# Patient Record
Sex: Male | Born: 1990 | Race: White | Hispanic: No | Marital: Single | Smoking: Current every day smoker
Health system: Southern US, Community
[De-identification: ages and names within clinical notes are randomized; demographics above are authoritative.]

## PROBLEM LIST (undated history)

## (undated) DIAGNOSIS — F3181 Bipolar II disorder: Secondary | ICD-10-CM

## (undated) DIAGNOSIS — F32A Depression, unspecified: Secondary | ICD-10-CM

## (undated) DIAGNOSIS — F329 Major depressive disorder, single episode, unspecified: Secondary | ICD-10-CM

## (undated) DIAGNOSIS — F909 Attention-deficit hyperactivity disorder, unspecified type: Secondary | ICD-10-CM

## (undated) DIAGNOSIS — F419 Anxiety disorder, unspecified: Secondary | ICD-10-CM

## (undated) HISTORY — DX: Major depressive disorder, single episode, unspecified: F32.9

## (undated) HISTORY — DX: Depression, unspecified: F32.A

## (undated) HISTORY — DX: Attention-deficit hyperactivity disorder, unspecified type: F90.9

## (undated) HISTORY — DX: Anxiety disorder, unspecified: F41.9

---

## 2011-07-26 ENCOUNTER — Ambulatory Visit: Payer: Self-pay | Admitting: Family Medicine

## 2011-07-26 LAB — HEPATIC FUNCTION PANEL
ALK PHOS: 79 U/L (ref 25–125)
ALT: 12 U/L (ref 3–30)
AST: 13 U/L (ref 2–40)
Bilirubin, Total: 0.2 mg/dL

## 2011-07-26 LAB — BASIC METABOLIC PANEL
BUN: 14 mg/dL (ref 4–21)
Creatinine: 1 mg/dL (ref <=1.3)
Glucose: 85 mg/dL
POTASSIUM: 4.4 mmol/L (ref 3.4–5.3)
SODIUM: 140 mmol/L (ref 137–147)

## 2011-07-26 LAB — CBC AND DIFFERENTIAL
HCT: 43 % (ref 41–53)
Hemoglobin: 14.9 g/dL (ref 13.5–17.5)
NEUTROS ABS: 3 /uL
Platelets: 371 10*3/uL (ref 150–399)
WBC: 6.3 10^3/mL

## 2014-03-25 ENCOUNTER — Emergency Department (HOSPITAL_COMMUNITY): Payer: Self-pay

## 2014-03-25 ENCOUNTER — Encounter (HOSPITAL_COMMUNITY): Payer: Self-pay | Admitting: Emergency Medicine

## 2014-03-25 ENCOUNTER — Emergency Department (HOSPITAL_COMMUNITY)
Admission: EM | Admit: 2014-03-25 | Discharge: 2014-03-25 | Disposition: A | Discharge status: Home / Self Care | Payer: Self-pay | Attending: Emergency Medicine | Admitting: Emergency Medicine

## 2014-03-25 DIAGNOSIS — S0990XA Unspecified injury of head, initial encounter: Secondary | ICD-10-CM | POA: Insufficient documentation

## 2014-03-25 DIAGNOSIS — F3189 Other bipolar disorder: Secondary | ICD-10-CM | POA: Insufficient documentation

## 2014-03-25 DIAGNOSIS — R569 Unspecified convulsions: Secondary | ICD-10-CM | POA: Insufficient documentation

## 2014-03-25 DIAGNOSIS — Y92410 Unspecified street and highway as the place of occurrence of the external cause: Secondary | ICD-10-CM | POA: Insufficient documentation

## 2014-03-25 DIAGNOSIS — R000 Tachycardia, unspecified: Secondary | ICD-10-CM | POA: Insufficient documentation

## 2014-03-25 DIAGNOSIS — Z79899 Other long term (current) drug therapy: Secondary | ICD-10-CM | POA: Insufficient documentation

## 2014-03-25 DIAGNOSIS — Y9389 Activity, other specified: Secondary | ICD-10-CM | POA: Insufficient documentation

## 2014-03-25 HISTORY — DX: Bipolar II disorder: F31.81

## 2014-03-25 LAB — CBC
HEMATOCRIT: 39.7 % (ref 39.0–52.0)
HEMOGLOBIN: 14.1 g/dL (ref 13.0–17.0)
MCH: 31.1 pg (ref 26.0–34.0)
MCHC: 35.5 g/dL (ref 30.0–36.0)
MCV: 87.6 fL (ref 78.0–100.0)
Platelets: 242 10*3/uL (ref 150–400)
RBC: 4.53 MIL/uL (ref 4.22–5.81)
RDW: 12.2 % (ref 11.5–15.5)
WBC: 11.3 10*3/uL — ABNORMAL HIGH (ref 4.0–10.5)

## 2014-03-25 LAB — HEPATIC FUNCTION PANEL
ALK PHOS: 55 U/L (ref 39–117)
ALT: 23 U/L (ref 0–53)
AST: 28 U/L (ref 0–37)
Albumin: 4 g/dL (ref 3.5–5.2)
TOTAL PROTEIN: 7 g/dL (ref 6.0–8.3)

## 2014-03-25 LAB — BASIC METABOLIC PANEL
BUN: 15 mg/dL (ref 6–23)
CHLORIDE: 104 meq/L (ref 96–112)
CO2: 16 meq/L — AB (ref 19–32)
Calcium: 9.1 mg/dL (ref 8.4–10.5)
Creatinine, Ser: 1.16 mg/dL (ref 0.50–1.35)
GFR calc Af Amer: >90 mL/min (ref >90)
GFR calc non Af Amer: 89 mL/min — ABNORMAL LOW (ref >90)
GLUCOSE: 104 mg/dL — AB (ref 70–99)
POTASSIUM: 3.8 meq/L (ref 3.7–5.3)
SODIUM: 141 meq/L (ref 137–147)

## 2014-03-25 LAB — RAPID URINE DRUG SCREEN, HOSP PERFORMED
AMPHETAMINES: NOT DETECTED
Barbiturates: NOT DETECTED
Benzodiazepines: NOT DETECTED
Cocaine: NOT DETECTED
Opiates: NOT DETECTED
Tetrahydrocannabinol: NOT DETECTED

## 2014-03-25 LAB — CBG MONITORING, ED: Glucose-Capillary: 109 mg/dL — ABNORMAL HIGH (ref 70–99)

## 2014-03-25 MED ORDER — IBUPROFEN 400 MG PO TABS
600.0000 mg | ORAL_TABLET | Freq: Once | ORAL | Status: AC
  Administered 2014-03-25: 600 mg via ORAL
  Filled 2014-03-25 (×2): qty 1

## 2014-03-25 MED ORDER — SODIUM CHLORIDE 0.9 % IV BOLUS (SEPSIS)
1000.0000 mL | Freq: Once | INTRAVENOUS | Status: AC
  Administered 2014-03-25: 1000 mL via INTRAVENOUS
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
## 2014-03-25 NOTE — ED Notes (Signed)
Seizure precautions initiated, suction set up, seizure pads on stretcher.

## 2014-03-25 NOTE — Discharge Instructions (Signed)
Followup with a neurologist as soon as possible for further evaluation.  Please avoid the following activities until evaluated by a neurologist: No driving. Avoid heights. Do not operate heavy machinery. No swimming.
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
## 2014-03-25 NOTE — ED Notes (Signed)
Updated family on plan of care.

## 2014-03-25 NOTE — ED Notes (Signed)
Pt reports taking one 20mg  Celexa tab this morning around 0930 and one 50mg  Tramadol tablet this morning at 0930.

## 2014-03-25 NOTE — ED Notes (Signed)
Pt is being discharged with family member to drive home. Pt is ambulatory and VS stable.
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
## 2014-03-27 ENCOUNTER — Encounter: Payer: Self-pay | Admitting: Diagnostic Neuroimaging

## 2014-03-27 ENCOUNTER — Ambulatory Visit (INDEPENDENT_AMBULATORY_CARE_PROVIDER_SITE_OTHER): Payer: Self-pay | Admitting: Radiology

## 2014-03-27 ENCOUNTER — Ambulatory Visit (INDEPENDENT_AMBULATORY_CARE_PROVIDER_SITE_OTHER): Payer: Self-pay | Admitting: Diagnostic Neuroimaging

## 2014-03-27 VITALS — BP 120/71 | HR 70 | Temp 98.2°F | Ht 67.0 in | Wt 142.0 lb

## 2014-03-27 DIAGNOSIS — R404 Transient alteration of awareness: Secondary | ICD-10-CM

## 2014-03-27 NOTE — Patient Instructions (Signed)
Stop tramadol.  I will check MRI and EEG.  No driving until seizure free x 6 months.
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
## 2014-03-29 ENCOUNTER — Inpatient Hospital Stay
Admission: RE | Admit: 2014-03-29 | Discharge: 2014-03-29 | Disposition: A | Discharge status: Home / Self Care | Payer: Self-pay | Source: Ambulatory Visit | Attending: Diagnostic Neuroimaging | Admitting: Diagnostic Neuroimaging

## 2014-04-05 ENCOUNTER — Ambulatory Visit
Admission: RE | Admit: 2014-04-05 | Discharge: 2014-04-05 | Disposition: A | Discharge status: Home / Self Care | Payer: Medicaid Other | Source: Ambulatory Visit | Attending: Diagnostic Neuroimaging | Admitting: Diagnostic Neuroimaging

## 2014-04-05 DIAGNOSIS — R404 Transient alteration of awareness: Secondary | ICD-10-CM

## 2014-04-05 MED ORDER — GADOBENATE DIMEGLUMINE 529 MG/ML IV SOLN
13.0000 mL | Freq: Once | INTRAVENOUS | Status: AC | PRN
  Administered 2014-04-05: 13 mL via INTRAVENOUS

## 2014-04-08 ENCOUNTER — Telehealth: Payer: Self-pay | Admitting: *Deleted

## 2014-04-08 NOTE — Telephone Encounter (Addendum)
Mother calling about results of EEG, MRI, but also pt had weekend of pressure in head (back and sides), comes and goes in waves, better in am then as time goes on gets worse.  Taking ibuprofen and not really helping.  Does not want any narcotics (since pt had issues when taking when hurt shoulder).  Relayed that test results not back yet.  She had called pcp and referred to ER, [facility], and said not to go, call on Monday.  ? Results and then go from there plan?

## 2014-04-09 ENCOUNTER — Telehealth: Payer: Self-pay | Admitting: *Deleted

## 2014-04-09 NOTE — Telephone Encounter (Signed)
Mother calling back regarding MRI results, very anxious and patient not feeling any better.  Thanks

## 2014-04-09 NOTE — Telephone Encounter (Signed)
Patient's mother said that she is concerned because he feels bad,waiting for MRI results, explained that once reviewed by physician , will contact,she verbalized understanding

## 2014-04-09 NOTE — Telephone Encounter (Signed)
I spoke with mother. Gave results. MRI and EEG are normal. Dx: new onset seizure (unprovoked). No anti-seizure meds for now. Driving restriction will stay in place. Post traumatic headache persistent. Will take more time to recover. Concerns addressed.   Suanne MarkerVIKRAM R. Awad Gladd, MD 04/09/2014, 3:50 PM Certified in Neurology, Neurophysiology and Neuroimaging  [facility] [address], [address] [place], [place] [address] [phone]

## 2014-04-11 NOTE — Procedures (Addendum)
   [facility]  EEG (ELECTROENCEPHALOGRAM) REPORT   STUDY DATE: 03/27/14 PATIENT NAME: Lillie Columbiayler M Bittel DOB: 1991-09-01 MRN: [id number]  ORDERING CLINICIAN: Joycelyn SchmidVikram Zarius Furr, MD   TECHNOLOGIST: Kaylyn LimSue Fox TECHNIQUE: Electroencephalogram was recorded utilizing standard 10-20 system of lead placement and reformatted into average and bipolar montages.  RECORDING TIME: 30 minutes ACTIVATION: hyperventilation and photic stimulation  CLINICAL INFORMATION: 23 year old male with seizure vs syncope.  FINDINGS: Background rhythms of 9-10 hertz and 20-30 microvolts. No focal, lateralizing, epileptiform activity or seizures are seen. Patient recorded in the awake state. EKG channel shows 54 beats per minute, regular rhythm.  IMPRESSION:  Normal EEG in the awake state.   INTERPRETING PHYSICIAN:  Suanne MarkerVIKRAM R. Taquana Bartley, MD Certified in Neurology, Neurophysiology and Neuroimaging  [facility] [address], [address] [place], [place] [address] [phone]

## 2014-06-27 ENCOUNTER — Ambulatory Visit: Payer: Medicaid Other | Admitting: Diagnostic Neuroimaging

## 2014-12-12 ENCOUNTER — Emergency Department: Payer: Self-pay | Admitting: Internal Medicine

## 2014-12-12 LAB — COMPREHENSIVE METABOLIC PANEL
ALBUMIN: 4.6 g/dL (ref 3.4–5.0)
ALT: 24 U/L
Alkaline Phosphatase: 65 U/L
Anion Gap: 9 (ref 7–16)
BUN: 10 mg/dL (ref 7–18)
Bilirubin,Total: 0.6 mg/dL (ref 0.2–1.0)
CALCIUM: 9.9 mg/dL (ref 8.5–10.1)
CREATININE: 1.19 mg/dL (ref 0.60–1.30)
Chloride: 105 mmol/L (ref 98–107)
Co2: 25 mmol/L (ref 21–32)
EGFR (Non-African Amer.): >60
Glucose: 125 mg/dL — ABNORMAL HIGH (ref 65–99)
Osmolality: 278 (ref 275–301)
Potassium: 4.4 mmol/L (ref 3.5–5.1)
SGOT(AST): 26 U/L (ref 15–37)
SODIUM: 139 mmol/L (ref 136–145)
Total Protein: 8.3 g/dL — ABNORMAL HIGH (ref 6.4–8.2)

## 2014-12-12 LAB — CBC
HCT: 46.1 % (ref 40.0–52.0)
HGB: 15.5 g/dL (ref 13.0–18.0)
MCH: 30.8 pg (ref 26.0–34.0)
MCHC: 33.5 g/dL (ref 32.0–36.0)
MCV: 92 fL (ref 80–100)
Platelet: 321 10*3/uL (ref 150–440)
RBC: 5.02 10*6/uL (ref 4.40–5.90)
RDW: 12.5 % (ref 11.5–14.5)
WBC: 7 10*3/uL (ref 3.8–10.6)

## 2014-12-12 LAB — URINALYSIS, COMPLETE
BACTERIA: NONE SEEN
BILIRUBIN, UR: NEGATIVE
BLOOD: NEGATIVE
Glucose,UR: NEGATIVE mg/dL (ref 0–75)
Ketone: NEGATIVE
Leukocyte Esterase: NEGATIVE
NITRITE: NEGATIVE
Ph: 8 (ref 4.5–8.0)
RBC,UR: NONE SEEN /HPF (ref 0–5)
SPECIFIC GRAVITY: 1.025 (ref 1.003–1.030)
Squamous Epithelial: NONE SEEN
WBC UR: NONE SEEN /HPF (ref 0–5)

## 2014-12-12 LAB — DRUG SCREEN, URINE
Amphetamines, Ur Screen: POSITIVE (ref ?–1000)
BARBITURATES, UR SCREEN: NEGATIVE (ref ?–200)
Benzodiazepine, Ur Scrn: POSITIVE (ref ?–200)
COCAINE METABOLITE, UR ~~LOC~~: NEGATIVE (ref ?–300)
Cannabinoid 50 Ng, Ur ~~LOC~~: POSITIVE (ref ?–50)
MDMA (Ecstasy)Ur Screen: NEGATIVE (ref ?–500)
METHADONE, UR SCREEN: NEGATIVE (ref ?–300)
Opiate, Ur Screen: NEGATIVE (ref ?–300)
Phencyclidine (PCP) Ur S: NEGATIVE (ref ?–25)
Tricyclic, Ur Screen: NEGATIVE (ref ?–1000)

## 2014-12-12 LAB — ETHANOL

## 2014-12-12 LAB — SALICYLATE LEVEL: Salicylates, Serum: <1.7 mg/dL

## 2014-12-12 LAB — ACETAMINOPHEN LEVEL: Acetaminophen: <2 ug/mL

## 2015-02-19 ENCOUNTER — Inpatient Hospital Stay: Payer: Self-pay | Admitting: Internal Medicine

## 2015-02-20 ENCOUNTER — Ambulatory Visit: Admit: 2015-02-20 | Disposition: A | Discharge status: Home / Self Care | Payer: Self-pay | Admitting: Neurology
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
## 2015-04-13 NOTE — H&P (Signed)
PATIENT NAME:  Jerry Mccarthy, Jerry Mccarthy MR#:  [id number] DATE OF BIRTH:  1991-09-25  DATE OF ADMISSION:  02/19/2015  PRIMARY CARE PHYSICIAN: Richard L. Sullivan Lone, MD  REFERRING PHYSICIAN: Eartha Inch. York Cerise, MD    CHIEF COMPLAINT: Decreased responsiveness today.   HISTORY OF PRESENT ILLNESS: A 24 year old Caucasian male with a history of seizure disorder, depression, and bipolar disorder, was sent to ED due to decreased mental status and responsiveness. The patient was intubated in the ED for airway protection. Unable to provide any information. According to the patient's mother, the patient was fine this morning, but he was found with decreased responsive at 3:00 p.m. today. He was found to have decreased response this afternoon and he vomited several times, so the patient was sent to the ED for further evaluation. The patient was unresponsive in the ED and vomited several times with coffee-ground vomitus. Dr. York Cerise intubated the patient for airway protection.   PAST MEDICAL HISTORY: Seizure disorder, depression, and bipolar disorder.   SOCIAL HISTORY: No smoking. No alcohol drinking or illicit drugs.  PAST SURGICAL HISTORY: Unknown.   FAMILY HISTORY: Unknown.   REVIEW OF SYSTEMS: Unable to obtain due to the patient's intubation status.   PHYSICAL EXAMINATION:  VITAL SIGNS: Temperature 98, blood pressure 145/92, pulse 60, O2 saturation 97% on ventilation.  GENERAL: The patient is unresponsive, critical ill looking, intubation status.  HEENT: Pupils round, equal, reactive to light. Some clear secretion. No discharge from ear or nose but has secretion from mouth. NGT suction coffee-ground vomitus.  NECK: Supple. No JVD or carotid bruit. No lymphadenopathy. No thyromegaly.  CARDIOVASCULAR: S1 and S2. Regular rate and rhythm. No murmurs or gallops.  PULMONARY: Bilateral air entry. Bilateral crackles and rhonchi. No wheezing.  ABDOMEN: Soft. No distention or tenderness. No organomegaly. Bowel sounds  present.  EXTREMITIES: No edema, clubbing or cyanosis. Bilateral pedal pulses present.  SKIN: No rash or jaundice.  NEUROLOGY: Unable to examine at this time.   LABORATORY DATA: ABG showed pH of 7.46, pO2 of 75, pCO2 of 37 with FiO2 of 30%. Chest x-ray worsening bilateral medial bibasilar heterogenous airspace obesity, right greater than left, possibly atelectasis, though worrisome for multifocal area of aspiration.   CAT scan of head: No acute intracranial findings. Maxillary and sphenoid sinus disease.   Chest x-ray: Before intubation showed low lung volume with basilar atelectasis.  CBC in normal range. Glucose 116, BUN 11, creatinine 0.99. Electrolytes are normal. Alcohol level less than 5. Urine drug screen showed tricyclic, antidepressant positive, cannabinoid positive, benzodiazepine positive. Acetaminophen less than 10, salicylate less than 4. TSH 1.117. Urinalysis is negative.   IMPRESSIONS:  1.  Altered mental status, possibly due to drug overdose.  2.  Acute respiratory failure, possibly due to drug overdose.  3.  Possible aspiration pneumonia due to vomiting.  4.  History of depression, bipolar disorder, and seizure disorder.   PLAN:  1.  The patient will be admitted to CCU. We will continue ventilation and follow up pulmonary consult. 2.  For altered mental status, which is possibly due to drug overdose, we will get a neurology consult.  3.  For aspiration pneumonia, I will start Zosyn IVPB, follow up CBC.  4.  The patient may need behavioral medicine physician consult after extubation.   I discussed the patient's critical condition with the patient's mother and grandmother.   CRITICAL TIME SPENT: About 67 minutes.    ____________________________ Shaune Pollack, MD qc:TM D: 02/19/2015 21:33:13 ET T: 02/19/2015 23:26:11 ET JOB#: [id number]  cc: Shaune PollackQing Kimon Loewen, MD, <Dictator> Shaune PollackQING Santasia Rew MD ELECTRONICALLY SIGNED 02/22/2015 16:42
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
## 2015-04-13 NOTE — Discharge Summary (Signed)
PATIENT NAME:  Jerry Mccarthy, Jerry Mccarthy MR#:  [id number] DATE OF BIRTH:  [birth year]  DATE OF ADMISSION:  02/19/2015 DATE OF DISCHARGE:  02/21/2015  DISCHARGE DIAGNOSES: 1.  Altered mental status, likely seizure episode causing respiratory distress requiring ventilator support and aspiration pneumonitis.  2.  Sleep deprivation, likely provoking the seizure. 3.  Bipolar disorder.   CONDITION ON DISCHARGE: Stable.   CODE STATUS ON DISCHARGE: FULL.  DISCHARGE MEDICATIONS: 1.  Citalopram 40 mg oral once a day.  2.  Quetiapine 100 mg oral once a day.  3.  Acetaminophen-butalbital every 6 hours as needed for headache.  4.  Amoxicillin/clavulanate 75/125 mg oral tablet every 12 hours for 4 days.   DIET ON DISCHARGE: Regular.   DIET CONSISTENCY: Regular.   DISCHARGE ACTIVITY: As tolerated.  TIMEFRAME TO FOLLOWUP: In 1 to 2 weeks with primary care physician.  HISTORY OF PRESENT ILLNESS: A 24 year old Caucasian male with history of seizure disorder, depression, and bipolar disorder sent to the Emergency Department because of decreased mental status and unresponsiveness. The patient was intubated in the Emergency Room for airway protection, unable to provide any history. According to his mother, he was fine in the morning, but around 3:00 p.Mccarthy. found decreased responsiveness. He vomited several times also and so was intubated by ER physician. The patient has a history of seizure disorder, depression, and bipolar disorder.   HOSPITAL COURSE AND STAY:  1.  Altered mental status, possibly due to drug overdose with suspected seizure. The patient was sleep deprived the previous night. A neurology consult was called in and he suggested to take regular and good amount of sleep, but no seizure medication was prescribed at this time. He was not on any seizure medication before admission. Initially he was intubated for airway protection because of that, but successfully extubated and remained fine after that.  2.   Possible aspiration pneumonia because of vomiting. There was mild rise in WBC and some x-ray findings, so we started on IV antibiotic but discharged on Augmentin at home.  3.  History of depression and bipolar disorder. Psych consult was called in to adjust his medication and they did it.  CONSULTANTS IN HOSPITAL: Neurology with Dr. Loretha BrasilZeylikman; pulmonary with Dr. Meredeth IdeFleming; psychiatry to see after discharge.   IMPORTANT DIAGNOSTIC DATA: WBC count 8.4, hemoglobin 12.9, platelet count 360,000.   Glucose 116, BUN 11, creatinine 0.99, sodium 140, potassium 3.8, chloride 102, and CO2 27.   Urinalysis is positive for tricyclic antidepressant, for cannabinoid and for benzodiazepine.   TSH 1.11.  Chest x-ray, portable single view, showed low lung volume and basilar atelectasis.   CT of the head: No acute bony findings.   ABG: PH 7.42, pCO2 of 49, pO2 is 82, and FiO2 is 36.   Urinalysis is positive for 2 WBCs. WBC count 11.6, hemoglobin 11.8.   TOTAL TIME SPENT ON THIS DISCHARGE: 35 minutes.   ____________________________ Hope PigeonVaibhavkumar G. Elisabeth PigeonVachhani, MD vgv:sb D: 02/25/2015 11:03:35 ET T: 02/25/2015 12:09:18 ET JOB#: [id number]  cc: Hope PigeonVaibhavkumar G. Elisabeth PigeonVachhani, MD, <Dictator> Richard L. Sullivan LoneGilbert, MD Altamese DillingVAIBHAVKUMAR Chidinma Clites MD ELECTRONICALLY SIGNED 03/04/2015 12:14

## 2015-06-09 DIAGNOSIS — R569 Unspecified convulsions: Secondary | ICD-10-CM | POA: Insufficient documentation

## 2015-06-09 DIAGNOSIS — G43909 Migraine, unspecified, not intractable, without status migrainosus: Secondary | ICD-10-CM | POA: Insufficient documentation

## 2015-06-09 DIAGNOSIS — F419 Anxiety disorder, unspecified: Secondary | ICD-10-CM | POA: Insufficient documentation

## 2015-06-09 DIAGNOSIS — F32A Depression, unspecified: Secondary | ICD-10-CM | POA: Insufficient documentation

## 2015-06-09 DIAGNOSIS — F329 Major depressive disorder, single episode, unspecified: Secondary | ICD-10-CM | POA: Insufficient documentation

## 2015-06-09 DIAGNOSIS — G4700 Insomnia, unspecified: Secondary | ICD-10-CM | POA: Insufficient documentation

## 2015-06-09 DIAGNOSIS — F988 Other specified behavioral and emotional disorders with onset usually occurring in childhood and adolescence: Secondary | ICD-10-CM | POA: Insufficient documentation

## 2015-06-09 DIAGNOSIS — F3130 Bipolar disorder, current episode depressed, mild or moderate severity, unspecified: Secondary | ICD-10-CM | POA: Insufficient documentation

## 2015-06-09 DIAGNOSIS — F1999 Other psychoactive substance use, unspecified with unspecified psychoactive substance-induced disorder: Secondary | ICD-10-CM | POA: Insufficient documentation

## 2015-06-12 ENCOUNTER — Ambulatory Visit: Payer: Self-pay | Admitting: Psychiatry

## 2015-07-07 ENCOUNTER — Other Ambulatory Visit: Payer: Self-pay

## 2015-07-07 MED ORDER — LAMOTRIGINE 100 MG PO TABS: 100.0000 mg | ORAL_TABLET | Freq: Every day | ORAL | Status: DC

## 2015-07-07 NOTE — Telephone Encounter (Signed)
pt called states he needs enough medication to do until his next appt on  07-17-15 on his lamctal

## 2015-07-07 NOTE — Telephone Encounter (Signed)
Patient has an upcoming appointment. Will order his Lamictal 100 mg at bedtime.

## 2015-07-09 NOTE — Telephone Encounter (Signed)
no answer and voice mail not set up will try back

## 2015-07-10 NOTE — Telephone Encounter (Signed)
left message that rx was sent.  

## 2015-07-14 ENCOUNTER — Ambulatory Visit: Payer: Self-pay | Admitting: Family Medicine

## 2015-07-17 ENCOUNTER — Ambulatory Visit: Payer: Self-pay | Admitting: Psychiatry

## 2015-07-17 IMAGING — CT CT HEAD WITHOUT CONTRAST
4 series · 18 of 30 positions shown, 19 images · non-contrast
Comparison: None.

CLINICAL DATA: Three day history of headache and vomiting.

EXAM:
CT HEAD WITHOUT CONTRAST
TECHNIQUE: Contiguous axial images were obtained from the base of the skull
through the vertex without intravenous contrast.

[Series 2: head wo · axial · 0.40mm/px · z∈[-494,-444]mm · 2 of 30 slices shown, 3 images]
[im 10/30  brain]
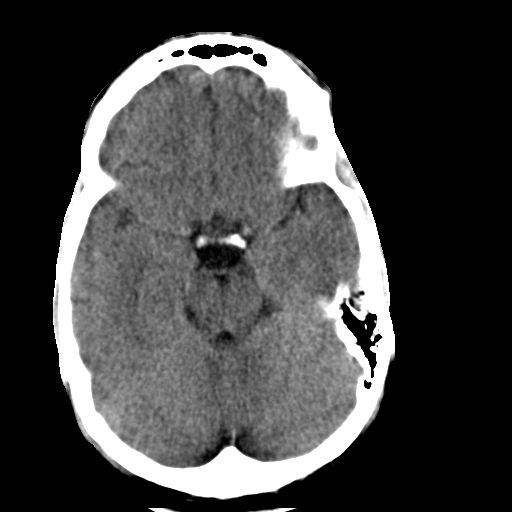
[im 10/30  bone]
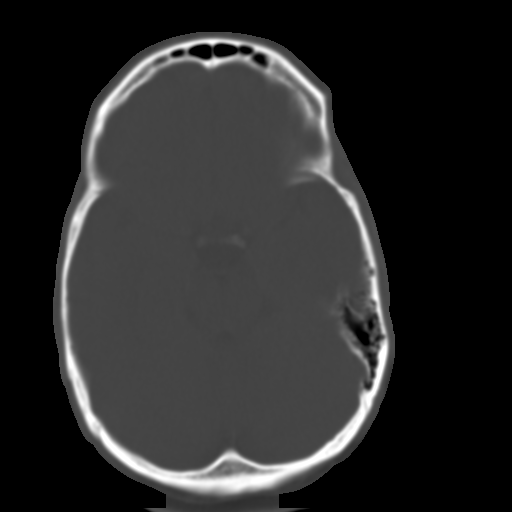
[im 20/30  brain]
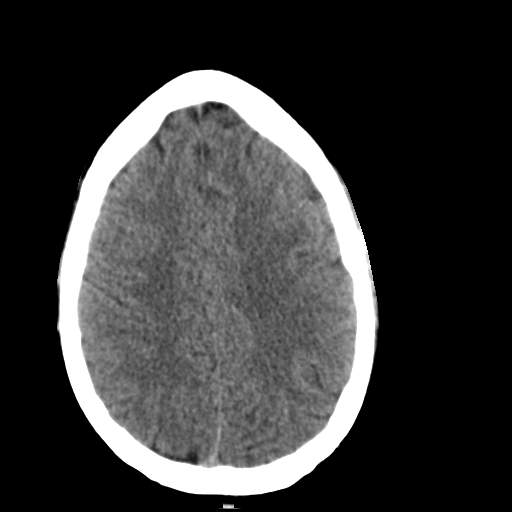

[Series 3: head bone · axial · 0.40mm/px · z∈[-521,-431]mm · 6 of 73 slices shown]
[im 10/73  bone]
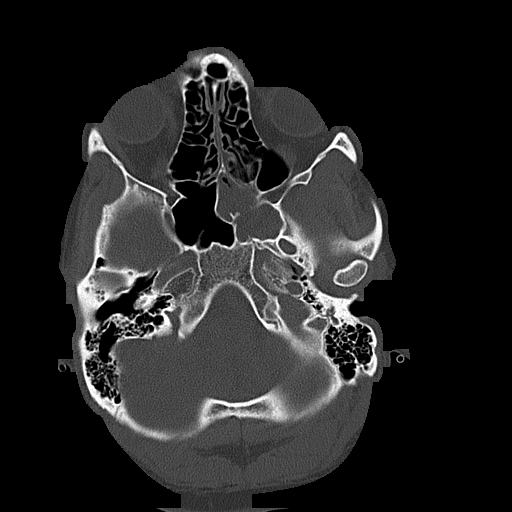
[im 19/73  bone]
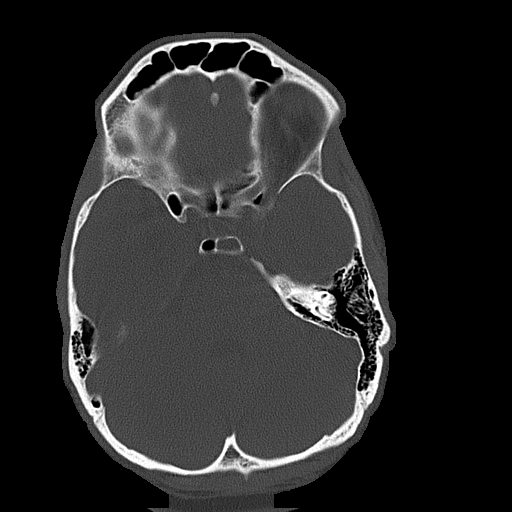
[im 28/73  bone]
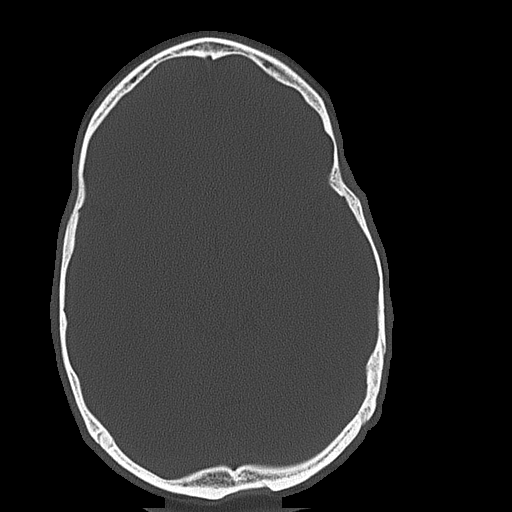
[im 37/73  bone]
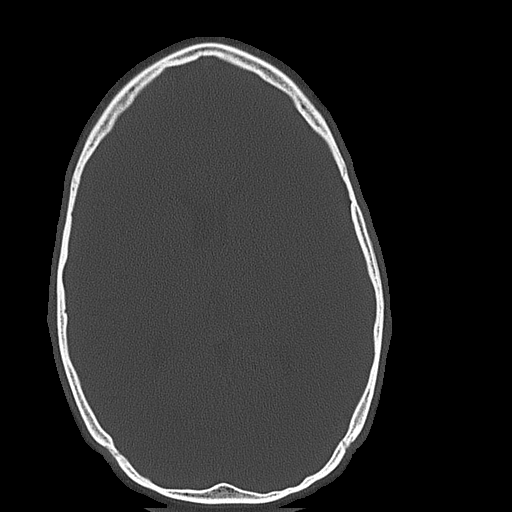
[im 46/73  bone]
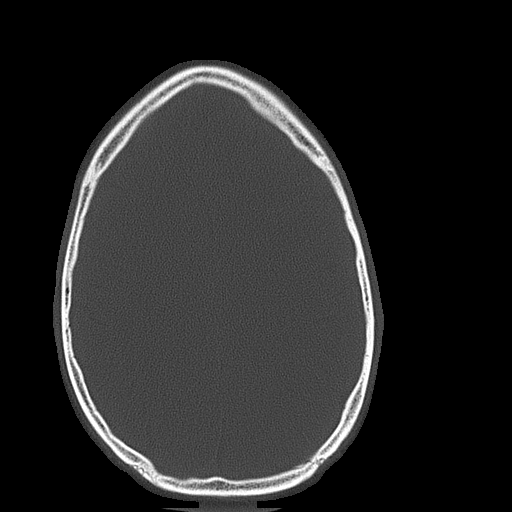
[im 55/73  bone]
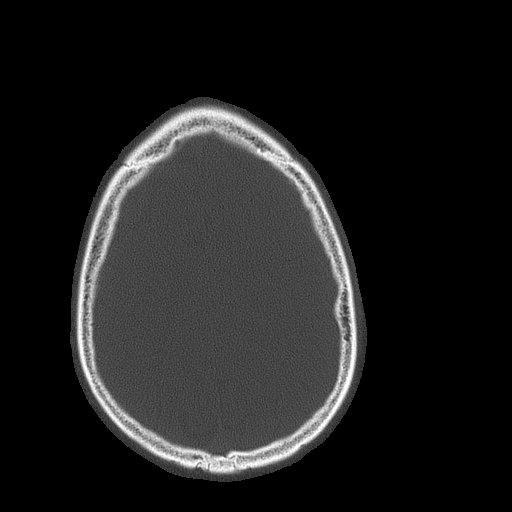

[Series 4: head wo recon · axial · 0.39mm/px · z∈[-468,-415]mm · 2 of 36 slices shown]
[im 12/36  brain]
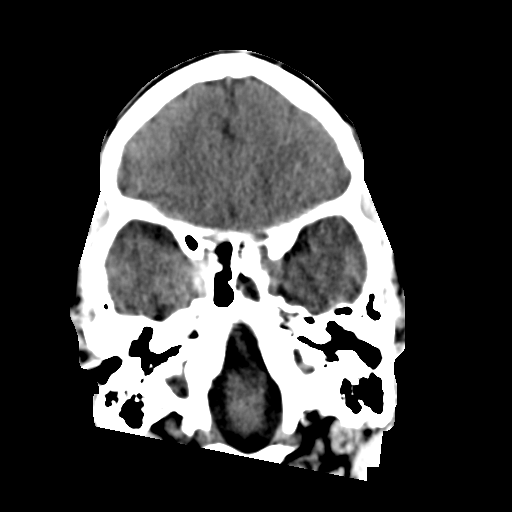
[im 24/36  brain]
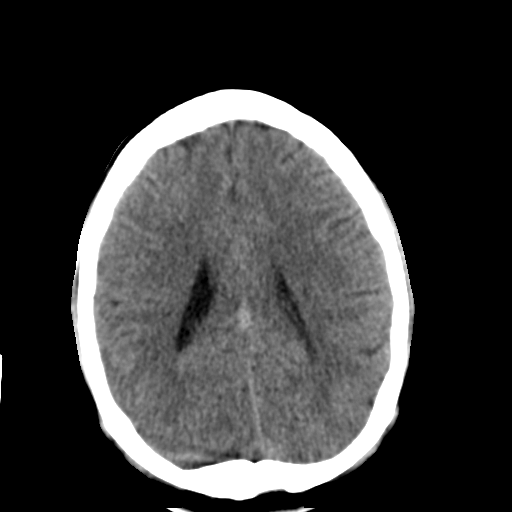

[Series 5: head bone recon · axial · 0.39mm/px · z∈[-520,-369]mm · 8 of 102 slices shown]
[im 9/102  bone]
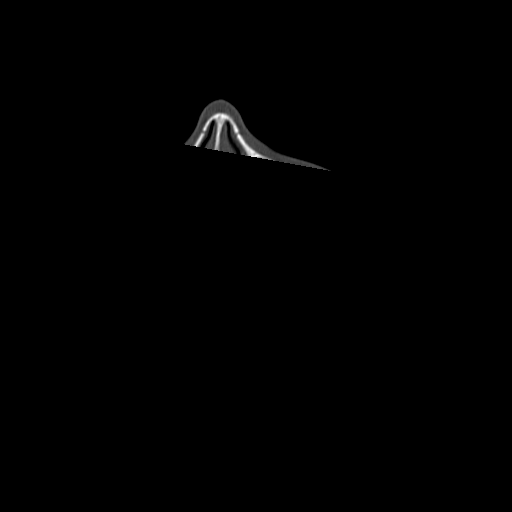
[im 26/102  bone]
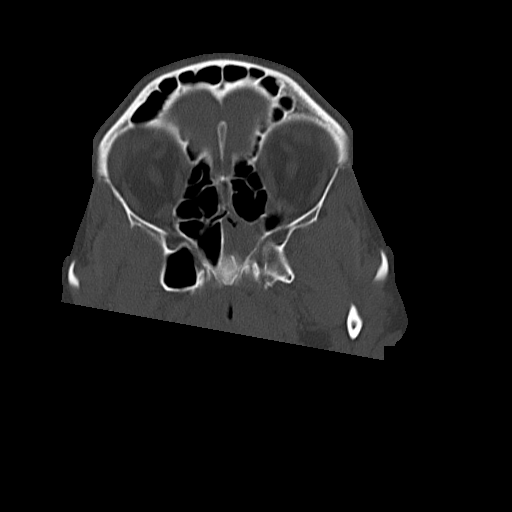
[im 34/102  bone]
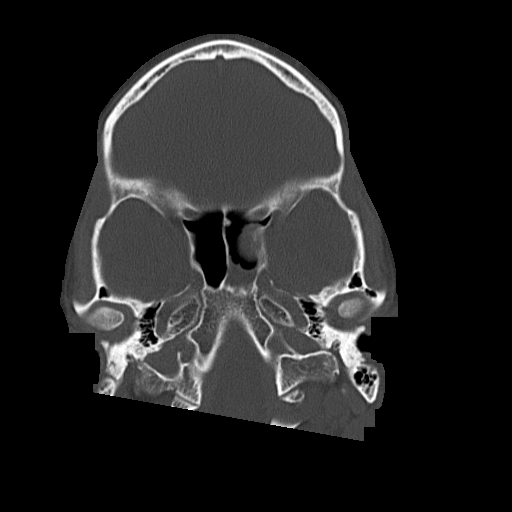
[im 43/102  bone]
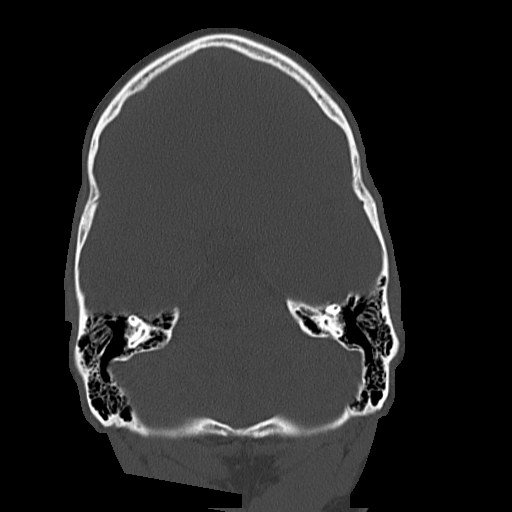
[im 59/102  bone]
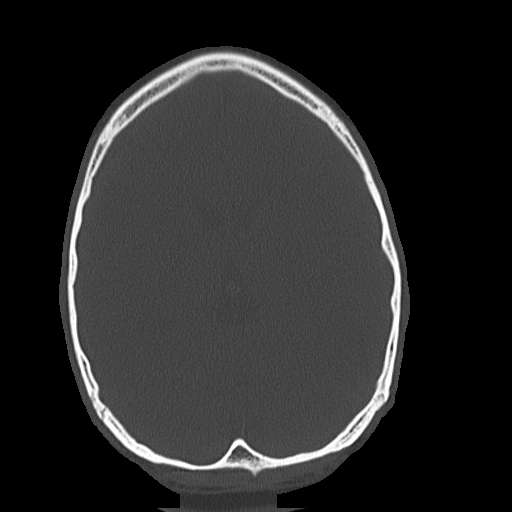
[im 68/102  bone]
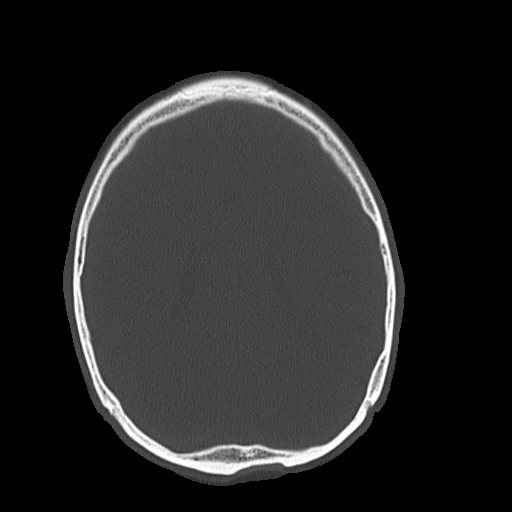
[im 76/102  bone]
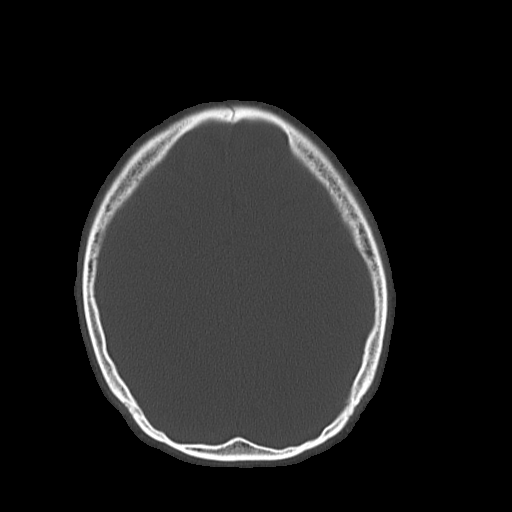
[im 93/102  bone]
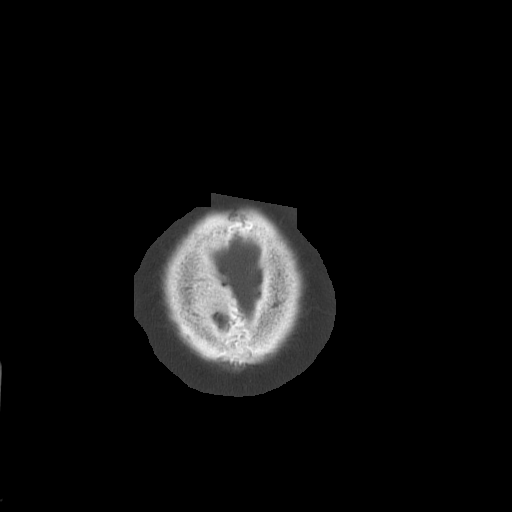

[18 of 30 positions shown; findings below may reference images not displayed]

FINDINGS: The ventricles are normal in size and configuration. No extra-axial
fluid collections are identified. The gray-white differentiation is
normal. No CT findings for acute intracranial process such as
hemorrhage or infarction. No mass lesions. The brainstem and
cerebellum are grossly normal.

The bony structures are intact. No acute skull fracture. The
visualized facial bones are intact. There is bilateral maxillary and
left half sphenoid sinus disease. The mastoid air cells and middle
ear cavities are clear and the frontal sinuses are clear. The globes
are intact.
IMPRESSION: No acute intracranial findings.

Maxillary and sphenoid sinus disease.

No acute bony findings.

## 2015-08-07 ENCOUNTER — Telehealth: Payer: Self-pay | Admitting: Psychiatry

## 2015-08-07 NOTE — Telephone Encounter (Signed)
Received a message from the patient's pharmacy, [facility] in [place] requesting a refill on the Lamictal. However it appears the patient has not been seen since May 2016. Did refill the Lamictal 100 mg at bedtime, #30 with no refills and instructed pharmacy informed patient he will need to come in for another appointment prior to any refills. AW

## 2015-08-13 ENCOUNTER — Emergency Department: Payer: 59

## 2015-08-13 ENCOUNTER — Encounter: Payer: Self-pay | Admitting: *Deleted

## 2015-08-13 ENCOUNTER — Emergency Department
Admission: EM | Admit: 2015-08-13 | Discharge: 2015-08-13 | Disposition: A | Discharge status: Home / Self Care | Payer: 59 | Attending: Emergency Medicine | Admitting: Emergency Medicine

## 2015-08-13 DIAGNOSIS — Y998 Other external cause status: Secondary | ICD-10-CM | POA: Insufficient documentation

## 2015-08-13 DIAGNOSIS — S99912A Unspecified injury of left ankle, initial encounter: Secondary | ICD-10-CM | POA: Diagnosis present

## 2015-08-13 DIAGNOSIS — Y9289 Other specified places as the place of occurrence of the external cause: Secondary | ICD-10-CM | POA: Diagnosis not present

## 2015-08-13 DIAGNOSIS — S93402A Sprain of unspecified ligament of left ankle, initial encounter: Secondary | ICD-10-CM

## 2015-08-13 DIAGNOSIS — Z79899 Other long term (current) drug therapy: Secondary | ICD-10-CM | POA: Diagnosis not present

## 2015-08-13 DIAGNOSIS — Y9367 Activity, basketball: Secondary | ICD-10-CM | POA: Insufficient documentation

## 2015-08-13 DIAGNOSIS — X58XXXA Exposure to other specified factors, initial encounter: Secondary | ICD-10-CM | POA: Diagnosis not present

## 2015-08-13 NOTE — ED Notes (Signed)
Pt has left ankle pain.  Injured today playing baseball.  Pt twisted ankle, but did not fall.

## 2015-08-13 NOTE — ED Provider Notes (Signed)
[facility] Emergency Department Provider Note ____________________________________________  Time seen: 2300  I have reviewed the triage vital signs and the nursing notes.  HISTORY  Chief Complaint  Ankle Pain  HPI Jerry Mccarthy is a 24 y.o. male reports to the ED with left ankle pain after twisting it while playing basketball today. He denies fall or other injury at this time.Pain is reported as 0/10 in triage.  Past Medical History  Diagnosis Date  . Bipolar 2 disorder   . Anxiety and depression     Patient Active Problem List   Diagnosis Date Noted  . ADD (attention deficit disorder) 06/09/2015  . Anxiety 06/09/2015  . Bipolar affect, depressed 06/09/2015  . Clinical depression 06/09/2015  . Headache, migraine 06/09/2015  . Cannot sleep 06/09/2015  . Drug-related disorder 06/09/2015  . Seizure 06/09/2015    No past surgical history on file.  Current Outpatient Rx  Name  Route  Sig  Dispense  Refill  . citalopram (CELEXA) 20 MG tablet   Oral   Take 20 mg by mouth daily.         . citalopram (CELEXA) 40 MG tablet   Oral   Take 40 mg by mouth at bedtime.      1   . ketorolac (TORADOL) 10 MG tablet   Oral   Take by mouth.         . lamoTRIgine (LAMICTAL) 100 MG tablet   Oral   Take 1 tablet (100 mg total) by mouth at bedtime.   30 tablet   0   . Multiple Vitamins-Minerals (MULTIVITAMIN PO)   Oral   Take 1 tablet by mouth daily.         . nabumetone (RELAFEN) 750 MG tablet   Oral   Take 750 mg by mouth 2 (two) times daily.         . QUEtiapine (SEROQUEL) 100 MG tablet   Oral   Take by mouth.         . QUEtiapine (SEROQUEL) 50 MG tablet   Oral   Take 50 mg by mouth at bedtime.         . SUMAtriptan (IMITREX) 100 MG tablet   Oral   Take by mouth.          Allergies Review of patient's allergies indicates no known allergies.  Family History  Problem Relation Age of Onset  . Depression Mother   .  Cancer Maternal Grandmother   . Bipolar disorder Maternal Grandmother   . Lung cancer Paternal Grandmother   . Hyperlipidemia Father     Social History Social History  Substance Use Topics  . Smoking status: Never Smoker   . Smokeless tobacco: Current User    Types: Chew  . Alcohol Use: Yes     Comment: occasional   Review of Systems  Constitutional: Negative for fever. Eyes: Negative for visual changes. ENT: Negative for sore throat. Cardiovascular: Negative for chest pain. Respiratory: Negative for shortness of breath. Gastrointestinal: Negative for abdominal pain, vomiting and diarrhea. Genitourinary: Negative for dysuria. Musculoskeletal: Negative for back pain. Left ankle pain as above. Skin: Negative for rash. Neurological: Negative for headaches, focal weakness or numbness. ____________________________________________  PHYSICAL EXAM:  VITAL SIGNS: ED Triage Vitals  Enc Vitals Group     BP 08/13/15 2123 129/57 mmHg     Pulse Rate 08/13/15 2123 104     Resp 08/13/15 2123 18     Temp 08/13/15 2123 98.2 F (36.8 C)  Temp Source 08/13/15 2123 Oral     SpO2 08/13/15 2123 98 %     Weight --      Height --      Head Cir --      Peak Flow --      Pain Score 08/13/15 2258 0     Pain Loc --      Pain Edu? --      Excl. in GC? --    Constitutional: Alert and oriented. Well appearing and in no distress. Eyes: Conjunctivae are normal. PERRL. Normal extraocular movements. ENT   Head: Normocephalic and atraumatic.   Nose: No congestion/rhinnorhea.   Mouth/Throat: Mucous membranes are moist.   Neck: Supple. No thyromegaly. Hematological/Lymphatic/Immunilogical: No cervical lymphadenopathy. Cardiovascular: Normal rate, regular rhythm. Normal distal pulses.  Respiratory: Normal respiratory effort. No wheezes/rales/rhonchi. Gastrointestinal: Soft and nontender. No distention. Musculoskeletal: Normal ankle exam without swelling, edema, ecchymosis, or  deformity. Negative drawer. Calf tenderness with negative Thompson test. Nontender with normal range of motion in all extremities.  Neurologic:  Normal gait without ataxia. Normal speech and language. No gross focal neurologic deficits are appreciated. Skin:  Skin is warm, dry and intact. No rash noted. Psychiatric: Mood and affect are normal. Patient exhibits appropriate insight and judgment. ____________________________________________   RADIOLOGY Left ankle IMPRESSION: No evidence of fracture or dislocation.  I, Tamorah Hada, Charlesetta Ivory, personally viewed and evaluated these images (plain radiographs) as part of my medical decision making.  ____________________________________________  PROCEDURES  Ankle stirrup ____________________________________________  INITIAL IMPRESSION / ASSESSMENT AND PLAN / ED COURSE  Grade I left ankle sprain. Dose ibuprofen as needed. Follow-up with Dr. Ernest Pine as needed. Activities as tolerated.  ____________________________________________  FINAL CLINICAL IMPRESSION(S) / ED DIAGNOSES  Final diagnoses:  Ankle sprain, left, initial encounter     Lissa Hoard, PA-C 08/13/15 2310  Loleta Rose, MD 08/13/15 [phone]

## 2015-08-13 NOTE — Discharge Instructions (Signed)
Ankle Sprain °An ankle sprain is an injury to the strong, fibrous tissues (ligaments) that hold the bones of your ankle joint together.  °CAUSES °An ankle sprain is usually caused by a fall or by twisting your ankle. Ankle sprains most commonly occur when you step on the outer edge of your foot, and your ankle turns inward. People who participate in sports are more prone to these types of injuries.  °SYMPTOMS  °· Pain in your ankle. The pain may be present at rest or only when you are trying to stand or walk. °· Swelling. °· Bruising. Bruising may develop immediately or within 1 to 2 days after your injury. °· Difficulty standing or walking, particularly when turning corners or changing directions. °DIAGNOSIS  °Your caregiver will ask you details about your injury and perform a physical exam of your ankle to determine if you have an ankle sprain. During the physical exam, your caregiver will press on and apply pressure to specific areas of your foot and ankle. Your caregiver will try to move your ankle in certain ways. An X-ray exam may be done to be sure a bone was not broken or a ligament did not separate from one of the bones in your ankle (avulsion fracture).  °TREATMENT  °Certain types of braces can help stabilize your ankle. Your caregiver can make a recommendation for this. Your caregiver may recommend the use of medicine for pain. If your sprain is severe, your caregiver may refer you to a surgeon who helps to restore function to parts of your skeletal system (orthopedist) or a physical therapist. °HOME CARE INSTRUCTIONS  °· Apply ice to your injury for 1-2 days or as directed by your caregiver. Applying ice helps to reduce inflammation and pain. °¨ Put ice in a plastic bag. °¨ Place a towel between your skin and the bag. °¨ Leave the ice on for 15-20 minutes at a time, every 2 hours while you are awake. °· Only take over-the-counter or prescription medicines for pain, discomfort, or fever as directed by  your caregiver. °· Elevate your injured ankle above the level of your heart as much as possible for 2-3 days. °· If your caregiver recommends crutches, use them as instructed. Gradually put weight on the affected ankle. Continue to use crutches or a cane until you can walk without feeling pain in your ankle. °· If you have a plaster splint, wear the splint as directed by your caregiver. Do not rest it on anything harder than a pillow for the first 24 hours. Do not put weight on it. Do not get it wet. You may take it off to take a shower or bath. °· You may have been given an elastic bandage to wear around your ankle to provide support. If the elastic bandage is too tight (you have numbness or tingling in your foot or your foot becomes cold and blue), adjust the bandage to make it comfortable. °· If you have an air splint, you may blow more air into it or let air out to make it more comfortable. You may take your splint off at night and before taking a shower or bath. Wiggle your toes in the splint several times per day to decrease swelling. °SEEK MEDICAL CARE IF:  °· You have rapidly increasing bruising or swelling. °· Your toes feel extremely cold or you lose feeling in your foot. °· Your pain is not relieved with medicine. °SEEK IMMEDIATE MEDICAL CARE IF: °· Your toes are numb or blue. °·   You have severe pain that is increasing. MAKE SURE YOU:   Understand these instructions.  Will watch your condition.  Will get help right away if you are not doing well or get worse. Document Released: 11/29/2005 Document Revised: 08/23/2012 Document Reviewed: 12/11/2011 [facility] Patient Information 2015 [place], [place]. This information is not intended to replace advice given to you by your health care provider. Make sure you discuss any questions you have with your health care provider.   Your exam and x-ray show a grade 1 ankle sprain without fracture.  Wear the splint as needed for support. See Dr. Ernest Pine if  needed.

## 2015-09-01 ENCOUNTER — Ambulatory Visit (INDEPENDENT_AMBULATORY_CARE_PROVIDER_SITE_OTHER): Payer: 59 | Admitting: Psychiatry

## 2015-09-01 ENCOUNTER — Encounter: Payer: Self-pay | Admitting: Psychiatry

## 2015-09-01 VITALS — BP 122/76 | HR 90 | Temp 97.0°F | Ht 68.0 in | Wt 165.6 lb

## 2015-09-01 DIAGNOSIS — F3130 Bipolar disorder, current episode depressed, mild or moderate severity, unspecified: Secondary | ICD-10-CM

## 2015-09-01 MED ORDER — ARIPIPRAZOLE 10 MG PO TABS: 10.0000 mg | ORAL_TABLET | Freq: Every day | ORAL | Status: DC

## 2015-09-01 MED ORDER — LAMOTRIGINE 150 MG PO TABS: 150.0000 mg | ORAL_TABLET | Freq: Every day | ORAL | Status: DC

## 2015-09-01 MED ORDER — CITALOPRAM HYDROBROMIDE 40 MG PO TABS: 40.0000 mg | ORAL_TABLET | Freq: Every day | ORAL | Status: DC

## 2015-09-01 NOTE — Progress Notes (Signed)
BH MD/PA/NP OP Progress Note  09/01/2015 2:41 PM Jerry Mccarthy  MRN:  [id number]  Subjective:  Patient was last seen in May 2016. He relates he missed some appointments. He states however he is continued Lamictal at 150 mg at bedtime, continued his Celexa at 40 mg at bedtime and his Seroquel at 200 mg at bedtime. He states overall the medications have kept him stable. His main complaint is that the Seroquel leaves him with some sedation throughout the day even when he takes it in the early evening the previous day.  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 He broke up in that he was very depressed and distraught over this situation. He states he ended up drinking 3 times a week and would drink 6-8 drinks each time. He states that this occurred about 3 weeks ago. However he states things have improved and is able to look at some positive things. 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 He states he's also made plans to re enroll in college next spring (ceased 2017) 6.  He feels that his current medications have been helping his mood and kept him stable. He does state that he would like to look at alternatives to Seroquel. I discussed Abilify as an option for mood stabilization. Patient indicates that he's been on this before. Review of the records do indicate he's been on the Abilify before. Patient indicates he tolerated it well. Chief Complaint:  Chief Complaint    Follow-up; Medication Refill; Medication Problem; Anxiety; Depression; Panic Attack     Visit Diagnosis:  No diagnosis found.  Past Medical History:  Past Medical History  Diagnosis Date  . Bipolar 2 disorder   . Anxiety and depression    History reviewed. No pertinent past surgical history. Family History:  Family History  Problem Relation Age of Onset  . Depression Mother   . Cancer Maternal  Grandmother   . Bipolar disorder Maternal Grandmother   . Lung cancer Paternal Grandmother   . Hyperlipidemia Father    Social History:  Social History   Social History  . Marital Status: Single    Spouse Name: N/A  . Number of Children: 0  . Years of Education: College   Occupational History  .  Other    [employer]   Social History Main Topics  . Smoking status: Never Smoker   . Smokeless tobacco: Current User    Types: Chew  . Alcohol Use: 6.0 oz/week    0 Glasses of wine, 10 Cans of beer, 0 Shots of liquor per week     Comment: occasional  . Drug Use: Yes    Special: Marijuana     Comment: last used 2 months ago  . Sexual Activity: Yes    Birth Control/ Protection: Condom   Other Topics Concern  . None   Social History Narrative   Patient lives on campus at [facility].   Caffeine Use: 2 cups daily   Additional History:   Assessment:   Musculoskeletal: Strength & Muscle Tone: within normal limits Gait & Station: normal Patient leans: N/A  Psychiatric Specialty Exam: HPI  Review of Systems  Psychiatric/Behavioral: Positive for substance abuse (Patient states he has been drinking 3 times per week as not for the past 2 days.). Negative for depression, suicidal ideas, hallucinations and memory loss. The patient is not  nervous/anxious and does not have insomnia.     Blood pressure 122/76, pulse 90, temperature 97 F (36.1 C), temperature source Tympanic, height  (1.727 m), weight 165 lb 9.6 oz (75.116 kg), SpO2 99 %.Body mass index is 25.19 kg/(m^2).  General Appearance: Well Groomed  Eye Contact:  Good  Speech:  Normal Rate  Volume:  Normal  Mood:  Good  Affect:  Bright, smiling  Thought Process:  Linear and Logical  Orientation:  Full (Time, Place, and Person)  Thought Content:  Negative  Suicidal Thoughts:  No  Homicidal Thoughts:  No  Memory:  Immediate;   Good Recent;   Good Remote;   Good  Judgement:  Good  Insight:  Good   Psychomotor Activity:  Negative  Concentration:  Good  Recall:  Good  Fund of Knowledge: Good  Language: Good  Akathisia:  Negative  Handed:  Right unknown   AIMS (if indicated):  Done 09/01/15 normal  Assets:  Communication Skills Desire for Improvement Social Support  ADL's:  Intact  Cognition: WNL  Sleep:  Good    Is the patient at risk to self?  No. Has the patient been a risk to self in the past 6 months?  No. Has the patient been a risk to self within the distant past?  No. Is the patient a risk to others?  No. Has the patient been a risk to others in the past 6 months?  No. Has the patient been a risk to others within the distant past?  No.  Current Medications: Current Outpatient Prescriptions  Medication Sig Dispense Refill  . citalopram (CELEXA) 40 MG tablet Take 1 tablet (40 mg total) by mouth at bedtime. 30 tablet 1  . lamoTRIgine (LAMICTAL) 150 MG tablet Take 1 tablet (150 mg total) by mouth at bedtime. 30 tablet 1  . ARIPiprazole (ABILIFY) 10 MG tablet Take 1 tablet (10 mg total) by mouth daily. 30 tablet 1   No current facility-administered medications for this visit.    Medical Decision Making:  Established Problem, Stable/Improving (1), Review of Medication Regimen & Side Effects (2) and Review of New Medication or Change in Dosage (2)  Treatment Plan Summary:Medication management and Plan We will continue the patient's citalopram at 40 mg daily, continue Lamictal at 150 mg daily, start Abilify 10 mg daily. Risk and benefits Abilify been discussed patient's able to consent. Patient follow up in 1 month. Did discuss with patient that in terms of dealing with stressors it might be important to engage in more treatment as opposed to discontinue treatment. Patient indicates he has made an appointment with therapist in this clinic today.   Wallace Going 09/01/2015, 2:41 PM

## 2015-09-30 ENCOUNTER — Ambulatory Visit (INDEPENDENT_AMBULATORY_CARE_PROVIDER_SITE_OTHER): Payer: 59 | Admitting: Psychiatry

## 2015-09-30 ENCOUNTER — Encounter: Payer: Self-pay | Admitting: Psychiatry

## 2015-09-30 VITALS — BP 120/60 | HR 87 | Temp 97.3°F | Ht 68.0 in | Wt 158.8 lb

## 2015-09-30 DIAGNOSIS — F3130 Bipolar disorder, current episode depressed, mild or moderate severity, unspecified: Secondary | ICD-10-CM

## 2015-09-30 MED ORDER — QUETIAPINE FUMARATE 100 MG PO TABS: ORAL_TABLET | ORAL | Status: DC

## 2015-09-30 MED ORDER — LAMOTRIGINE 150 MG PO TABS: 150.0000 mg | ORAL_TABLET | Freq: Every day | ORAL | Status: DC

## 2015-09-30 NOTE — Progress Notes (Signed)
BH MD/PA/NP OP Progress Note  09/30/2015 2:29 PM Jerry Mccarthy  MRN:  [id number]  Subjective:  Patient returns follow-up as bipolar disorder type 2 most recent episode depressed. He stands restarting his medications citalopram, Abilify and Lamictal he states his mood is even and he feels good. He states however since changing from Seroquel to Abilify he noticed these not sleeping as well. The action states he feels like Seroquel was better for him and would like to take that medicine instead of Abilify.  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 He states his mood is good and his appetite is good and denies any side effects from his medications. Chief Complaint:   Visit Diagnosis:     ICD-9-CM ICD-10-CM   1. Bipolar I disorder, most recent episode depressed (HCC) 296.50 F31.30     Past Medical History:  Past Medical History  Diagnosis Date  . Bipolar 2 disorder   . Anxiety and depression    No past surgical history on file. Family History:  Family History  Problem Relation Age of Onset  . Depression Mother   . Cancer Maternal Grandmother   . Bipolar disorder Maternal Grandmother   . Lung cancer Paternal Grandmother   . Hyperlipidemia Father    Social History:  Social History   Social History  . Marital Status: Single    Spouse Name: N/A  . Number of Children: 0  . Years of Education: College   Occupational History  .  Other    [employer]   Social History Main Topics  . Smoking status: Never Smoker   . Smokeless tobacco: Current User    Types: Chew  . Alcohol Use: 6.0 oz/week    0 Glasses of wine, 10 Cans of beer, 0 Shots of liquor per week     Comment: occasional  . Drug Use: Yes    Special: Marijuana     Comment: last used 2 months ago  . Sexual Activity: Yes    Birth Control/ Protection: Condom   Other Topics Concern  . Not on file   Social History Narrative   Patient lives on campus at [facility].   Caffeine Use: 2  cups daily   Additional History:   Assessment:   Musculoskeletal: Strength & Muscle Tone: within normal limits Gait & Station: normal Patient leans: N/A  Psychiatric Specialty Exam: HPI  Review of Systems  Psychiatric/Behavioral: Negative for depression, suicidal ideas, hallucinations, memory loss and substance abuse (Patient states he has been drinking 3 times per week as not for the past 2 days.). The patient has insomnia. The patient is not nervous/anxious.   All other systems reviewed and are negative.   There were no vitals taken for this visit.There is no weight on file to calculate BMI.  General Appearance: Well Groomed  Eye Contact:  Good  Speech:  Normal Rate  Volume:  Normal  Mood:  Good  Affect:  Bright, smiling  Thought Process:  Linear and Logical  Orientation:  Full (Time, Place, and Person)  Thought Content:  Negative  Suicidal Thoughts:  No  Homicidal Thoughts:  No  Memory:  Immediate;   Good Recent;   Good Remote;   Good  Judgement:  Good  Insight:  Good  Psychomotor Activity:  Negative  Concentration:  Good  Recall:  Good  Fund of Knowledge: Good  Language: Good  Akathisia:  Negative  Handed:  Right unknown   AIMS (if indicated):  Done 09/01/15 normal  Assets:  Communication Skills Desire for Improvement Social Support  ADL's:  Intact  Cognition: WNL  Sleep:  Good    Is the patient at risk to self?  No. Has the patient been a risk to self in the past 6 months?  No. Has the patient been a risk to self within the distant past?  No. Is the patient a risk to others?  No. Has the patient been a risk to others in the past 6 months?  No. Has the patient been a risk to others within the distant past?  No.  Current Medications: Current Outpatient Prescriptions  Medication Sig Dispense Refill  . citalopram (CELEXA) 40 MG tablet Take 1 tablet (40 mg total) by mouth at bedtime. 30 tablet 1  . lamoTRIgine (LAMICTAL) 150 MG tablet Take 1 tablet (150 mg  total) by mouth at bedtime. 30 tablet 2  . QUEtiapine (SEROQUEL) 100 MG tablet One half a tablet at bedtime for seven days then increase to one whole tablet at bedtime. 30 tablet 1   No current facility-administered medications for this visit.    Medical Decision Making:  Established Problem, Stable/Improving (1), Review of Medication Regimen & Side Effects (2) and Review of New Medication or Change in Dosage (2)  Treatment Plan Summary:Medication management and Plan   Bipolar disorder type II, most recent episode depressed We will continue the patient's citalopram at 40 mg daily, continue Lamictal at 150 mg daily. Patient had reported that the Seroquel made him sleepy. However he states he likes this effect and feels like maybe he just needs to take the Seroquel earlier in the evening. As such we will discontinue his Abilify. We will restart Seroquel at 50 [address]igan's at bedtime for 7 days and then he'll go to 100 millions at bedtime.   Patient follow up in 1 month.   Wallace Goinglton Casy Tavano 09/30/2015, 2:29 PM

## 2015-10-01 ENCOUNTER — Other Ambulatory Visit: Payer: Self-pay

## 2015-10-01 MED ORDER — CITALOPRAM HYDROBROMIDE 40 MG PO TABS: 40.0000 mg | ORAL_TABLET | Freq: Every day | ORAL | Status: DC

## 2015-10-01 NOTE — Telephone Encounter (Signed)
request for refill on citalopram

## 2015-10-01 NOTE — Telephone Encounter (Signed)
Patient prefers medication be sent to mail order. Sent in citalopram 40 mg, #90 with no refills. AW

## 2015-10-30 ENCOUNTER — Ambulatory Visit (INDEPENDENT_AMBULATORY_CARE_PROVIDER_SITE_OTHER): Payer: 59 | Admitting: Psychiatry

## 2015-10-30 ENCOUNTER — Encounter: Payer: Self-pay | Admitting: Psychiatry

## 2015-10-30 VITALS — BP 122/78 | HR 85 | Temp 97.1°F | Ht 68.0 in | Wt 159.2 lb

## 2015-10-30 DIAGNOSIS — F3130 Bipolar disorder, current episode depressed, mild or moderate severity, unspecified: Secondary | ICD-10-CM

## 2015-10-30 MED ORDER — QUETIAPINE FUMARATE 100 MG PO TABS: 100.0000 mg | ORAL_TABLET | Freq: Every day | ORAL | Status: DC

## 2015-10-30 MED ORDER — LAMOTRIGINE 150 MG PO TABS: 150.0000 mg | ORAL_TABLET | Freq: Every day | ORAL | Status: DC

## 2015-10-30 NOTE — Progress Notes (Signed)
BH MD/PA/NP OP Progress Note  10/30/2015 2:57 PM Jerry Mccarthy  MRN:  [id number]  Subjective:  Patient returns follow-up as bipolar disorder type 2 most recent episode depressed. He indicated that he feels like his mood has been stable on his Lamictal, Seroquel and citalopram. He states he continues to go to work. He states that he's made plans to start college in the spring semester. He states the only thing that's left for him to do is to address the finances. He sleeping well and his appetite is good.  His only complaint today is ability to focus. He seems indicated son so much of an issue of racing thoughts or hypomania but rather just easily distracted such as one thought is coming into his mind and he cannot focus on things at work. I discussed with him that perhaps he should pursue getting testing for ADHD. Once we have the results of this testing we can assess for possible treatment. Chief Complaint: Focus Chief Complaint    Follow-up; Medication Refill     Visit Diagnosis:     ICD-9-CM ICD-10-CM   1. Bipolar I disorder, most recent episode depressed (HCC) 296.50 F31.30     Past Medical History:  Past Medical History  Diagnosis Date  . Bipolar 2 disorder (HCC)   . Anxiety and depression    History reviewed. No pertinent past surgical history. Family History:  Family History  Problem Relation Age of Onset  . Depression Mother   . Cancer Maternal Grandmother   . Bipolar disorder Maternal Grandmother   . Lung cancer Paternal Grandmother   . Hyperlipidemia Father    Social History:  Social History   Social History  . Marital Status: Single    Spouse Name: N/A  . Number of Children: 0  . Years of Education: College   Occupational History  .  Other    [employer]   Social History Main Topics  . Smoking status: Never Smoker   . Smokeless tobacco: Current User    Types: Chew  . Alcohol Use: 0.0 - 2.4 oz/week    0 Glasses of wine, 0-4 Cans of beer, 0 Shots of  liquor per week     Comment: occasional  . Drug Use: Yes    Special: Marijuana     Comment: last used june  . Sexual Activity: Yes    Birth Control/ Protection: Condom   Other Topics Concern  . None   Social History Narrative   Patient lives on campus at [facility]   Caffeine Use: 2 cups daily   Additional History:   Assessment:   Musculoskeletal: Strength & Muscle Tone: within normal limits Gait & Station: normal Patient leans: N/A  Psychiatric Specialty Exam: HPI  Review of Systems  Psychiatric/Behavioral: Negative for depression, suicidal ideas, hallucinations, memory loss and substance abuse. The patient is not nervous/anxious and does not have insomnia.   All other systems reviewed and are negative.   Blood pressure 122/78, pulse 85, temperature 97.1 F (36.2 C), temperature source Tympanic, height  (1.727 m), weight 159 lb 3.2 oz (72.213 kg), SpO2 98 %.Body mass index is 24.21 kg/(m^2).  General Appearance: Well Groomed  Eye Contact:  Good  Speech:  Normal Rate  Volume:  Normal  Mood:  Good  Affect:  Congruent  Thought Process:  Linear and Logical  Orientation:  Full (Time, Place, and Person)  Thought Content:  Negative  Suicidal Thoughts:  No  Homicidal Thoughts:  No  Memory:  Immediate;   Good Recent;   Good Remote;   Good  Judgement:  Good  Insight:  Good  Psychomotor Activity:  Negative  Concentration:  Good  Recall:  Good  Fund of Knowledge: Good  Language: Good  Akathisia:  Negative  Handed:  Right unknown   AIMS (if indicated):  Done 09/01/15 normal  Assets:  Communication Skills Desire for Improvement Social Support  ADL's:  Intact  Cognition: WNL  Sleep:  Good    Is the patient at risk to self?  No. Has the patient been a risk to self in the past 6 months?  No. Has the patient been a risk to self within the distant past?  No. Is the patient a risk to others?  No. Has the patient been a risk to others in the past 6 months?   No. Has the patient been a risk to others within the distant past?  No.  Current Medications: Current Outpatient Prescriptions  Medication Sig Dispense Refill  . citalopram (CELEXA) 40 MG tablet Take 1 tablet (40 mg total) by mouth at bedtime. 90 tablet 0  . lamoTRIgine (LAMICTAL) 150 MG tablet Take 1 tablet (150 mg total) by mouth at bedtime. 90 tablet 0  . QUEtiapine (SEROQUEL) 100 MG tablet Take 1 tablet (100 mg total) by mouth at bedtime. 90 tablet 0   No current facility-administered medications for this visit.    Medical Decision Making:  Established Problem, Stable/Improving (1), Review of Medication Regimen & Side Effects (2) and Review of New Medication or Change in Dosage (2)  Treatment Plan Summary:Medication management and Plan   Bipolar disorder type II, most recent episode depressed We will continue the patient's citalopram at 40 mg daily, continue Lamictal at 150 mg daily and Seroquel 100 mg at bedtime.   I have given him the contact information for Elna BreslowBruce Thompson PhD to do ADHD testing.   Patient follow up in 2 month.   Wallace Goinglton Kierah Goatley 10/30/2015, 2:57 PM

## 2015-11-13 NOTE — Progress Notes (Signed)
These medications were refilled on  10-30-15

## 2015-11-21 ENCOUNTER — Other Ambulatory Visit: Payer: Self-pay | Admitting: Psychiatry

## 2015-12-18 ENCOUNTER — Other Ambulatory Visit: Payer: Self-pay | Admitting: Psychiatry

## 2015-12-18 NOTE — Telephone Encounter (Signed)
Sent and Lamictal 150 mg, #90 with 2 refills. AW

## 2015-12-25 ENCOUNTER — Ambulatory Visit: Payer: 59 | Admitting: Psychiatry

## 2015-12-26 ENCOUNTER — Ambulatory Visit (INDEPENDENT_AMBULATORY_CARE_PROVIDER_SITE_OTHER): Payer: 59 | Admitting: Psychiatry

## 2015-12-26 ENCOUNTER — Encounter: Payer: Self-pay | Admitting: Psychiatry

## 2015-12-26 VITALS — BP 122/80 | HR 108 | Temp 97.4°F | Ht 68.0 in | Wt 172.6 lb

## 2015-12-26 DIAGNOSIS — F3130 Bipolar disorder, current episode depressed, mild or moderate severity, unspecified: Secondary | ICD-10-CM | POA: Diagnosis not present

## 2015-12-26 MED ORDER — QUETIAPINE FUMARATE 50 MG PO TABS: 50.0000 mg | ORAL_TABLET | Freq: Every day | ORAL | Status: DC

## 2015-12-26 MED ORDER — BUPROPION HCL ER (XL) 150 MG PO TB24: 150.0000 mg | ORAL_TABLET | Freq: Every day | ORAL | Status: DC

## 2015-12-26 MED ORDER — CITALOPRAM HYDROBROMIDE 40 MG PO TABS: 40.0000 mg | ORAL_TABLET | Freq: Every day | ORAL | Status: DC

## 2015-12-26 MED ORDER — LAMOTRIGINE 150 MG PO TABS: 150.0000 mg | ORAL_TABLET | Freq: Every day | ORAL | Status: DC

## 2015-12-26 NOTE — Progress Notes (Signed)
BH MD/PA/NP OP Progress Note  12/26/2015 12:20 PM Jerry Mccarthy  MRN:  [id number]  Subjective:  Patient returns follow-up as bipolar disorder type 2 most recent episode depressed. He presents today with his mother. She indicates that the patient really has not been doing well for which she estimates is a year. I discussed with her that he has had follow-ups with me in which he said he is doing well. She indicated she felt that perhaps what was Mccarthy on is that his baseline of not feeling well may be was his normal. Mother and patient state that he typically has been having low mood and racing thoughts. Patient denies manic symptoms aside from the racing thoughts. Patient mother indicated that sometimes the only thing that can settle his racing thoughts is marijuana. I discussed this patient he states is not a daily thing and he last use 1 week ago. He states that his Seroquel helps but he states sometimes it leaves him too lethargic and groggy in the morning. He states he is about to go to school and feels like he might need to stay up or actually get up in the morning and is concerned about the Seroquel causing problems. Options would be to add a mood stabilizer for bipolar depressed state or perhaps just address the complain of depression which appears to be the main issue today.  Patient mother discussed how he is down and at times hopeless. I spoke patient in private and he indicated he is not suicidal and that was a low point that he was in a year ago but that he is not at that point now. Chief Complaint: down Chief Complaint    Follow-up; Medication Refill; Anxiety; Depression; Panic Attack; Stress; Fatigue     Visit Diagnosis:   No diagnosis found.  Past Medical History:  Past Medical History  Diagnosis Date  . Bipolar 2 disorder (HCC)   . Anxiety and depression    History reviewed. No pertinent past surgical history. Family History:  Family History  Problem Relation Age of Onset  .  Depression Mother   . Cancer Maternal Grandmother   . Bipolar disorder Maternal Grandmother   . Lung cancer Paternal Grandmother   . Hyperlipidemia Father    Social History:  Social History   Social History  . Marital Status: Single    Spouse Name: N/A  . Number of Children: 0  . Years of Education: College   Occupational History  .  Other    [employer]   Social History Main Topics  . Smoking status: Current Every Day Smoker -- 0.25 packs/day    Types: Cigarettes    Start date: 12/25/2014  . Smokeless tobacco: Former NeurosurgeonUser    Types: Chew  . Alcohol Use: 0.0 - 2.4 oz/week    0 Glasses of wine, 0-4 Cans of beer, 0 Shots of liquor per week     Comment: occasional  . Drug Use: Yes    Special: Marijuana     Comment: last used on 12-18-15  . Sexual Activity: Yes    Birth Control/ Protection: Condom   Other Topics Concern  . None   Social History Narrative   [facility] [employer]   Caffeine Use: 2 cups daily   Additional History:   Assessment:   Musculoskeletal: Strength & Muscle Tone: within normal limits Gait & Station: normal Patient leans: N/A  Psychiatric Specialty Exam: Anxiety Patient reports no insomnia, nervous/anxious behavior or suicidal ideas.  Depression        Associated symptoms include does not have insomnia and no suicidal ideas.  Past medical history includes anxiety.     Review of Systems  Psychiatric/Behavioral: Positive for depression. Negative for suicidal ideas, hallucinations, memory loss and substance abuse. The patient is not nervous/anxious and does not have insomnia.   All other systems reviewed and are negative.   Blood pressure 122/80, pulse 108, temperature 97.4 F (36.3 C), temperature source Tympanic, height 5\' 8"  (1.727 m), weight 172 lb 9.6 oz (78.291 kg), SpO2 99 %.Body mass index is 26.25 kg/(m^2).  General Appearance: Well Groomed  Eye Contact:  Good  Speech:  Normal Rate  Volume:  Normal   Mood:  Not good   Affect:  Congruent  Thought Process:  Linear and Logical  Orientation:  Full (Time, Place, and Person)  Thought Content:  Negative  Suicidal Thoughts:  No  Homicidal Thoughts:  No  Memory:  Immediate;   Good Recent;   Good Remote;   Good  Judgement:  Good  Insight:  Good  Psychomotor Activity:  Negative  Concentration:  Good  Recall:  Good  Fund of Knowledge: Good  Language: Good  Akathisia:  Negative  Handed:  Right unknown   AIMS (if indicated):  Done 09/01/15 normal  Assets:  Communication Skills Desire for Improvement Social Support  ADL's:  Intact  Cognition: WNL  Sleep:  Good    Is the patient at risk to self?  No. Has the patient been a risk to self in the past 6 months?  No. Has the patient been a risk to self within the distant past?  No. Is the patient a risk to others?  No. Has the patient been a risk to others in the past 6 months?  No. Has the patient been a risk to others within the distant past?  No.  Current Medications: Current Outpatient Prescriptions  Medication Sig Dispense Refill  . citalopram (CELEXA) 40 MG tablet Take 1 tablet (40 mg total) by mouth daily. 90 tablet 1  . lamoTRIgine (LAMICTAL) 150 MG tablet Take 1 tablet (150 mg total) by mouth daily. 90 tablet 1  . QUEtiapine (SEROQUEL) 50 MG tablet Take 1 tablet (50 mg total) by mouth at bedtime. 90 tablet 1  . buPROPion (WELLBUTRIN XL) 150 MG 24 hr tablet Take 1 tablet (150 mg total) by mouth daily. 30 tablet 1   No current facility-administered medications for this visit.    Medical Decision Making:  Established Problem, Stable/Improving (1), Review of Medication Regimen & Side Effects (2) and Review of New Medication or Change in Dosage (2)  Treatment Plan Summary:Medication management and Plan   Bipolar disorder type II, most recent episode depressed We will continue the patient's citalopram at 40 mg daily, continue Lamictal at 150 mg daily and we'll decrease the  Seroquel to 50 mg at bedtime because of his complaints of it making him too lethargic in the mornings. To add Wellbutrin XL 150 mg in the morning to address his complaint of depressed mood and sadness.   Patient follow up in 1 month. I explained the patient will be departing the clinic in February but that I would see him again in follow-up. Patient is aware it can transition to another provider within this clinic. However patient indicated that he is Mccarthy to school and is Mccarthy to be seeing a psychologist and a psychiatrist there and thus he might just have follow-up at that location for my  departure.  Jerry Mccarthy 12/26/2015, 12:20 PM

## 2016-01-22 ENCOUNTER — Ambulatory Visit: Payer: 59 | Admitting: Psychiatry

## 2016-03-18 ENCOUNTER — Emergency Department (HOSPITAL_COMMUNITY)
Admission: EM | Admit: 2016-03-18 | Discharge: 2016-03-19 | Disposition: A | Discharge status: Other Acute Inpatient Hospital | Payer: 59 | Attending: Emergency Medicine | Admitting: Emergency Medicine

## 2016-03-18 DIAGNOSIS — F419 Anxiety disorder, unspecified: Secondary | ICD-10-CM | POA: Insufficient documentation

## 2016-03-18 DIAGNOSIS — Z880 Allergy status to penicillin: Secondary | ICD-10-CM | POA: Diagnosis not present

## 2016-03-18 DIAGNOSIS — F314 Bipolar disorder, current episode depressed, severe, without psychotic features: Secondary | ICD-10-CM | POA: Diagnosis not present

## 2016-03-18 DIAGNOSIS — F1910 Other psychoactive substance abuse, uncomplicated: Secondary | ICD-10-CM | POA: Diagnosis not present

## 2016-03-18 DIAGNOSIS — F17210 Nicotine dependence, cigarettes, uncomplicated: Secondary | ICD-10-CM | POA: Diagnosis not present

## 2016-03-18 DIAGNOSIS — Z79899 Other long term (current) drug therapy: Secondary | ICD-10-CM | POA: Diagnosis not present

## 2016-03-18 DIAGNOSIS — F3181 Bipolar II disorder: Secondary | ICD-10-CM | POA: Diagnosis not present

## 2016-03-18 DIAGNOSIS — F1210 Cannabis abuse, uncomplicated: Secondary | ICD-10-CM | POA: Insufficient documentation

## 2016-03-18 DIAGNOSIS — F3130 Bipolar disorder, current episode depressed, mild or moderate severity, unspecified: Secondary | ICD-10-CM | POA: Diagnosis present

## 2016-03-18 DIAGNOSIS — F1010 Alcohol abuse, uncomplicated: Secondary | ICD-10-CM | POA: Diagnosis not present

## 2016-03-18 DIAGNOSIS — R45851 Suicidal ideations: Secondary | ICD-10-CM | POA: Diagnosis not present

## 2016-03-18 NOTE — ED Notes (Signed)
Pt stated he is an alcoholic and is seeking help with this addiction.

## 2016-03-19 ENCOUNTER — Inpatient Hospital Stay (HOSPITAL_COMMUNITY): Admission: AD | Admit: 2016-03-19 | Payer: 59 | Source: Intra-hospital | Admitting: Psychiatry

## 2016-03-19 ENCOUNTER — Inpatient Hospital Stay (HOSPITAL_COMMUNITY)
Admission: AD | Admit: 2016-03-19 | Discharge: 2016-03-22 | DRG: 885 | Disposition: A | Discharge status: Home / Self Care | Payer: 59 | Source: Intra-hospital | Attending: Psychiatry | Admitting: Psychiatry

## 2016-03-19 ENCOUNTER — Encounter (HOSPITAL_COMMUNITY): Payer: Self-pay

## 2016-03-19 ENCOUNTER — Encounter (HOSPITAL_COMMUNITY): Payer: Self-pay | Admitting: *Deleted

## 2016-03-19 DIAGNOSIS — F1910 Other psychoactive substance abuse, uncomplicated: Secondary | ICD-10-CM | POA: Insufficient documentation

## 2016-03-19 DIAGNOSIS — F1010 Alcohol abuse, uncomplicated: Secondary | ICD-10-CM | POA: Diagnosis not present

## 2016-03-19 DIAGNOSIS — F17210 Nicotine dependence, cigarettes, uncomplicated: Secondary | ICD-10-CM | POA: Diagnosis present

## 2016-03-19 DIAGNOSIS — F319 Bipolar disorder, unspecified: Secondary | ICD-10-CM | POA: Insufficient documentation

## 2016-03-19 DIAGNOSIS — F314 Bipolar disorder, current episode depressed, severe, without psychotic features: Secondary | ICD-10-CM | POA: Diagnosis present

## 2016-03-19 DIAGNOSIS — F10129 Alcohol abuse with intoxication, unspecified: Secondary | ICD-10-CM | POA: Diagnosis present

## 2016-03-19 DIAGNOSIS — R45851 Suicidal ideations: Secondary | ICD-10-CM

## 2016-03-19 DIAGNOSIS — Y906 Blood alcohol level of 120-199 mg/100 ml: Secondary | ICD-10-CM | POA: Diagnosis present

## 2016-03-19 LAB — COMPREHENSIVE METABOLIC PANEL
ALT: 24 U/L (ref 17–63)
AST: 18 U/L (ref 15–41)
Albumin: 5 g/dL (ref 3.5–5.0)
Alkaline Phosphatase: 62 U/L (ref 38–126)
Anion gap: 13 (ref 5–15)
BILIRUBIN TOTAL: 0.4 mg/dL (ref 0.3–1.2)
BUN: 14 mg/dL (ref 6–20)
CHLORIDE: 106 mmol/L (ref 101–111)
CO2: 21 mmol/L — ABNORMAL LOW (ref 22–32)
Calcium: 9.4 mg/dL (ref 8.9–10.3)
Creatinine, Ser: 1.14 mg/dL (ref 0.61–1.24)
Glucose, Bld: 104 mg/dL — ABNORMAL HIGH (ref 65–99)
POTASSIUM: 4.2 mmol/L (ref 3.5–5.1)
Sodium: 140 mmol/L (ref 135–145)
TOTAL PROTEIN: 7.9 g/dL (ref 6.5–8.1)

## 2016-03-19 LAB — CBC
HCT: 42.9 % (ref 39.0–52.0)
Hemoglobin: 15.2 g/dL (ref 13.0–17.0)
MCH: 31.1 pg (ref 26.0–34.0)
MCHC: 35.4 g/dL (ref 30.0–36.0)
MCV: 87.7 fL (ref 78.0–100.0)
PLATELETS: 340 10*3/uL (ref 150–400)
RBC: 4.89 MIL/uL (ref 4.22–5.81)
RDW: 12.4 % (ref 11.5–15.5)
WBC: 7 10*3/uL (ref 4.0–10.5)

## 2016-03-19 LAB — RAPID URINE DRUG SCREEN, HOSP PERFORMED
Amphetamines: NOT DETECTED
BENZODIAZEPINES: NOT DETECTED
Barbiturates: NOT DETECTED
Cocaine: NOT DETECTED
OPIATES: NOT DETECTED
Tetrahydrocannabinol: POSITIVE — AB

## 2016-03-19 LAB — SALICYLATE LEVEL

## 2016-03-19 LAB — ETHANOL: ALCOHOL ETHYL (B): 123 mg/dL — AB (ref <5)

## 2016-03-19 LAB — ACETAMINOPHEN LEVEL: Acetaminophen (Tylenol), Serum: <10 ug/mL — ABNORMAL LOW (ref 10–30)

## 2016-03-19 MED ORDER — NICOTINE 21 MG/24HR TD PT24
21.0000 mg | MEDICATED_PATCH | Freq: Every day | TRANSDERMAL | Status: DC
  Filled 2016-03-19: qty 1

## 2016-03-19 MED ORDER — ONDANSETRON HCL 4 MG PO TABS: 4.0000 mg | ORAL_TABLET | Freq: Three times a day (TID) | ORAL | Status: DC | PRN

## 2016-03-19 MED ORDER — CITALOPRAM HYDROBROMIDE 40 MG PO TABS
40.0000 mg | ORAL_TABLET | Freq: Every day | ORAL | Status: DC
  Filled 2016-03-19: qty 1

## 2016-03-19 MED ORDER — CITALOPRAM HYDROBROMIDE 40 MG PO TABS
40.0000 mg | ORAL_TABLET | Freq: Every day | ORAL | Status: DC
  Administered 2016-03-19 – 2016-03-22 (×4): 40 mg via ORAL
  Filled 2016-03-19 (×6): qty 1
  Filled 2016-03-19: qty 2

## 2016-03-19 MED ORDER — LAMOTRIGINE 100 MG PO TABS
100.0000 mg | ORAL_TABLET | Freq: Every day | ORAL | Status: DC
  Administered 2016-03-20 – 2016-03-22 (×3): 100 mg via ORAL
  Filled 2016-03-19 (×6): qty 1

## 2016-03-19 MED ORDER — QUETIAPINE FUMARATE 50 MG PO TABS: 50.0000 mg | ORAL_TABLET | Freq: Every day | ORAL | Status: DC

## 2016-03-19 MED ORDER — NICOTINE POLACRILEX 2 MG MT GUM
2.0000 mg | CHEWING_GUM | OROMUCOSAL | Status: DC | PRN
  Administered 2016-03-20 – 2016-03-22 (×6): 2 mg via ORAL
  Filled 2016-03-19 (×3): qty 1

## 2016-03-19 MED ORDER — LAMOTRIGINE 100 MG PO TABS
100.0000 mg | ORAL_TABLET | Freq: Every day | ORAL | Status: DC
  Administered 2016-03-19: 100 mg via ORAL
  Filled 2016-03-19: qty 1

## 2016-03-19 MED ORDER — LORAZEPAM 1 MG PO TABS: 0.0000 mg | ORAL_TABLET | Freq: Two times a day (BID) | ORAL | Status: DC

## 2016-03-19 MED ORDER — ACETAMINOPHEN 325 MG PO TABS: 650.0000 mg | ORAL_TABLET | Freq: Four times a day (QID) | ORAL | Status: DC | PRN

## 2016-03-19 MED ORDER — MAGNESIUM HYDROXIDE 400 MG/5ML PO SUSP: 30.0000 mL | Freq: Every day | ORAL | Status: DC | PRN

## 2016-03-19 MED ORDER — LORAZEPAM 1 MG PO TABS: 0.0000 mg | ORAL_TABLET | Freq: Four times a day (QID) | ORAL | Status: AC

## 2016-03-19 MED ORDER — ALUM & MAG HYDROXIDE-SIMETH 200-200-20 MG/5ML PO SUSP
30.0000 mL | ORAL | Status: DC | PRN
Start: 2016-03-19 — End: 2016-03-22

## 2016-03-19 MED ORDER — NICOTINE 21 MG/24HR TD PT24
21.0000 mg | MEDICATED_PATCH | Freq: Every day | TRANSDERMAL | Status: DC
  Administered 2016-03-19: 21 mg via TRANSDERMAL
  Filled 2016-03-19: qty 1

## 2016-03-19 MED ORDER — ALUM & MAG HYDROXIDE-SIMETH 200-200-20 MG/5ML PO SUSP
15.0000 mL | Freq: Four times a day (QID) | ORAL | Status: DC | PRN
  Administered 2016-03-19: 15 mL via ORAL
  Filled 2016-03-19: qty 30

## 2016-03-19 MED ORDER — LORAZEPAM 1 MG PO TABS: 0.0000 mg | ORAL_TABLET | Freq: Four times a day (QID) | ORAL | Status: DC

## 2016-03-19 MED ORDER — QUETIAPINE FUMARATE 50 MG PO TABS
50.0000 mg | ORAL_TABLET | Freq: Every day | ORAL | Status: DC
  Administered 2016-03-19: 50 mg via ORAL
  Filled 2016-03-19 (×4): qty 1

## 2016-03-19 NOTE — BH Assessment (Signed)
[facility] Assessment Progress Note  Per Thedore MinsMojeed Akintayo, MD, this pt requires psychiatric hospitalization at this time.  Baird KayEric Shamberg, RN, [facility].  Pt has signed Voluntary Admission and Consent for Treatment, as well as Consent to Release Information, and signed [facility].  Pt's nurse, Diane, has been notified, and agrees to send original paperwork along with pt via [facility], and to call report to [phone].  Doylene Canninghomas Edward Trevino, MA Triage Specialist [phone]

## 2016-03-19 NOTE — ED Notes (Signed)
Pt continues to request help with his alcohol problem, but he denies withdrawal symptoms at this time. He said that he mostly has a problem with sleep and anxiety. Over the past few weeks he has had suicidal feelings, but does not feel that way today.

## 2016-03-19 NOTE — BH Assessment (Signed)
Tele Assessment Note   Jerry Mccarthy is an 25 y.o. male [facility] reporting intermittent suicidal ideations and requesting help for his substance use. Pt stated "I am really dependent on alcohol and I am trying to get better". Pt denies SI at this time but it has been documented that when pt presented to the ED he was suicidal with a plan to shoot himself. Pt is at risk for suicide due to multiple stressors: legal issues, financial and relationship problems. Pt also has access to weapons and a history of depression. Pt denies HI and AVH at this time. Pt reported that he is currently receiving medication management but did not report any psychiatric hospitalizations. 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 Pt denies physical, sexual and emotional abuse.  Inpatient treatment is recommended.   Diagnosis: Bipolar   Past Medical History:  Past Medical History  Diagnosis Date  . Bipolar 2 disorder (HCC)   . Anxiety and depression     History reviewed. No pertinent past surgical history.  Family History:  Family History  Problem Relation Age of Onset  . Depression Mother   . Cancer Maternal Grandmother   . Bipolar disorder Maternal Grandmother   . Lung cancer Paternal Grandmother   . Hyperlipidemia Father     Social History:  reports that he has been smoking Cigarettes.  He started smoking about 14 months ago. He has been smoking about 0.25 packs per day. He has quit using smokeless tobacco. His smokeless tobacco use included Chew. He reports that he drinks alcohol. He reports that he uses illicit drugs (Marijuana).  Additional Social History:  Alcohol / Drug Use Prescriptions: Pt reported that he has abused Adderal in the past.  History of alcohol / drug use?: Yes Substance #1 Name of Substance 1: Alcohol  1 - Age of First Use: 20 1 - Amount (size/oz): "1 pint up to a 1/5"  1 - Frequency: 3x weekly  1 - Duration: ongoing  1 - Last Use / Amount:  03-18-16 Substance #2 Name of Substance 2: THC  2 - Age of First Use: 20 2 - Amount (size/oz): varies  2 - Duration: ongoing  2 - Last Use / Amount: 03-13-16 Substance #3 Name of Substance 3: Adderall  3 - Age of First Use: 23 3 - Amount (size/oz): 6-20mg   3 - Last Use / Amount: Feb 2017  CIWA: CIWA-Ar BP: 117/70 mmHg Pulse Rate: 98 Nausea and Vomiting: no nausea and no vomiting Tactile Disturbances: none Tremor: no tremor Auditory Disturbances: not present Paroxysmal Sweats: no sweat visible Visual Disturbances: not present Anxiety: no anxiety, at ease Headache, Fullness in Head: none present Agitation: normal activity Orientation and Clouding of Sensorium: oriented and can do serial additions CIWA-Ar Total: 0 COWS:    PATIENT STRENGTHS: (choose at least two) Average or above average intelligence Communication skills Motivation for treatment/growth  Allergies:  Allergies  Allergen Reactions  . Amoxicillin-Pot Clavulanate Nausea And Vomiting    Home Medications:  (Not in a hospital admission)  OB/GYN Status:  No LMP for male patient.  General Assessment Data Location of Assessment: [facility] TTS Assessment: In system Is this a Tele or Face-to-Face Assessment?: Face-to-Face Is this an Initial Assessment or a Re-assessment for this encounter?: Initial Assessment Marital status: Single Living Arrangements: Parent Can pt return to current living arrangement?: Yes Admission Status: Voluntary Is patient capable of signing voluntary admission?: Yes Referral Source: Self/Family/Friend Insurance type: [facility]  Crisis Care Plan Living Arrangements: Parent Name of Psychiatrist: Dr. Mayford Knife  Name of Therapist: No provider reported   Education Status Is patient currently in school?: No Current Grade: N/A Highest grade of school patient has completed: N/A Name of school: N/A Contact person: N/A  Risk to self with the past 6 months Suicidal Ideation: No-Not  Currently/Within Last 6 Months Has patient been a risk to self within the past 6 months prior to admission? : No Suicidal Intent: No-Not Currently/Within Last 6 Months Has patient had any suicidal intent within the past 6 months prior to admission? : No Is patient at risk for suicide?: No Suicidal Plan?: No-Not Currently/Within Last 6 Months Has patient had any suicidal plan within the past 6 months prior to admission? : No Access to Means: No Specify Access to Suicidal Means: Pt denies having a plan  What has been your use of drugs/alcohol within the last 12 months?: Adderall, alcohola and marijuana.  Previous Attempts/Gestures: No How many times?: 0 Other Self Harm Risks: Pt denies  Triggers for Past Attempts: None known (No attempts reported. ) Intentional Self Injurious Behavior: None Family Suicide History: No Recent stressful life event(s): Financial Problems, Legal Issues Persecutory voices/beliefs?: No Depression: Yes Depression Symptoms: Tearfulness, Fatigue, Guilt, Loss of interest in usual pleasures, Feeling worthless/self pity, Feeling angry/irritable Substance abuse history and/or treatment for substance abuse?: Yes Suicide prevention information given to non-admitted patients: Not applicable  Risk to Others within the past 6 months Homicidal Ideation: No Does patient have any lifetime risk of violence toward others beyond the six months prior to admission? : No Thoughts of Harm to Others: No Current Homicidal Intent: No Current Homicidal Plan: No Access to Homicidal Means: No Identified Victim: N/A History of harm to others?: No Assessment of Violence: None Noted Violent Behavior Description: No violent behaviors observed. Pt is calm and cooperative at this time.  Does patient have access to weapons?: Yes (Comment) ("guns, rifles, shot guns and knives" ) Criminal Charges Pending?: Yes Describe Pending Criminal Charges: open container, expired registration  Does  patient have a court date: Yes Court Date: 04/16/16 Is patient on probation?: No  Psychosis Hallucinations: None noted Delusions: None noted  Mental Status Report Appearance/Hygiene: In scrubs Eye Contact: Good Motor Activity: Unremarkable Speech: Logical/coherent Level of Consciousness: Quiet/awake Mood: Euthymic Affect: Appropriate to circumstance Anxiety Level: Minimal Thought Processes: Coherent, Relevant Judgement: Partial Orientation: Person, Time, Situation, Place Obsessive Compulsive Thoughts/Behaviors: None  Cognitive Functioning Concentration: Decreased Memory: Recent Intact, Remote Intact IQ: Average Insight: Fair Impulse Control: Fair Appetite: Good Weight Loss: 0 Weight Gain: 10 ("10-15" ) Sleep: No Change Total Hours of Sleep: 10 Vegetative Symptoms: None  ADLScreening [facility] Assessment Services) Patient's cognitive ability adequate to safely complete daily activities?: Yes Patient able to express need for assistance with ADLs?: Yes Independently performs ADLs?: Yes (appropriate for developmental age)  Prior Inpatient Therapy Prior Inpatient Therapy: No  Prior Outpatient Therapy Prior Outpatient Therapy: Yes Prior Therapy Dates: 2017 Prior Therapy Facilty/Provider(s): Dr. Mayford Knife  Reason for Treatment: medication management  Does patient have an ACCT team?: No Does patient have Intensive In-House Services?  : No Does patient have [facility] services? : No Does patient have P4CC services?: No  ADL Screening (condition at time of admission) Patient's cognitive ability adequate to safely complete daily activities?: Yes Is the patient deaf or have difficulty hearing?: No Does the patient have difficulty seeing, even when wearing glasses/contacts?: No Does the patient have difficulty concentrating, remembering, or making decisions?:  No Patient able to express need for assistance with ADLs?: Yes Does the patient have difficulty dressing or bathing?:  No Independently performs ADLs?: Yes (appropriate for developmental age) Does the patient have difficulty walking or climbing stairs?: No       Abuse/Neglect Assessment (Assessment to be complete while patient is alone) Physical Abuse: Denies Verbal Abuse: Denies Sexual Abuse: Denies Exploitation of patient/patient's resources: Denies Self-Neglect: Denies     Merchant navy officer (For Healthcare) Does patient have an advance directive?: No Would patient like information on creating an advanced directive?: No - patient declined information    Additional Information 1:1 In Past 12 Months?: No CIRT Risk: No Elopement Risk: No Does patient have medical clearance?: Yes     Disposition:  Disposition Initial Assessment Completed for this Encounter: Yes Disposition of Patient: Inpatient treatment program Type of inpatient treatment program: Adult  Nanci Lakatos S 03/19/2016 1:54 AM

## 2016-03-19 NOTE — ED Notes (Signed)
Patient received calm and cooperative with nursing assessment. Patient denies SI/ HI, A/ V H, depression, anxiety and pain at this time. Patient contracted for safety. q 15 min checks for safety

## 2016-03-19 NOTE — ED Notes (Signed)
[facility]. He was alert and oriented x3, in no distress.

## 2016-03-19 NOTE — BH Assessment (Signed)
Assessment completed. Consulted Alberteen SamFran Hobson, NP who recommended inpatient treatment. TTS to seek placement. Informed Sabino DickGail Shulz, NP of the recommendation.

## 2016-03-19 NOTE — Consult Note (Signed)
[facility] Face-to-Face Psychiatry Consult   Reason for Consult:  Alcohol abuse and depression with suicide plan Referring Physician:  EDP Patient Identification: Jerry Mccarthy MRN:  [id number] Principal Diagnosis: Bipolar affect, depressed (HCC) Diagnosis:   Patient Active Problem List   Diagnosis Date Noted  . Alcohol abuse [F10.10] 03/19/2016    Priority: High  . Bipolar affect, depressed (HCC) [F31.30] 06/09/2015    Priority: High  . ADD (attention deficit disorder) [F90.9] 06/09/2015  . Anxiety [F41.9] 06/09/2015  . Clinical depression [F32.9] 06/09/2015  . Headache, migraine [G43.909] 06/09/2015  . Cannot sleep [G47.00] 06/09/2015  . Drug-related disorder (HCC) [F19.99] 06/09/2015  . Seizure (HCC) [R56.9] 06/09/2015    Total Time spent with patient: 45 minutes  Subjective:   Jerry Mccarthy is a 25 y.o. male patient admitted with alcohol dependence, depression, and suicide plan.  HPI:  On admission:  25 y.o. male presenting to [facility] reporting intermittent suicidal ideations and requesting help for his substance use. Pt stated "I am really dependent on alcohol and I am trying to get better". Pt denies SI at this time but it has been documented that when pt presented to the ED he was suicidal with a plan to shoot himself. Pt is at risk for suicide due to multiple stressors: legal issues, financial and relationship problems. Pt also has access to weapons and a history of depression. Pt denies HI and AVH at this time. Pt reported that he is currently receiving medication management but did not report any psychiatric hospitalizations. 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 Pt denies physical, sexual and emotional abuse.   Today:  Patient continues to endorse depression and suicidal ideations.  He recently broke up with a long-time girlfriend, "brutal break up."  One past suicide attempt by "scratching" his arms.  Drinking 3-4 days weekly with 8-10 shots or a pint of  alcohol.  He had to leave school because of the flu, legal charges for empty bottle of liquor in the car.  He has been treated by Dr. Mayford Knife at [place].  Sleep issues and anxiety have been increased, Wellbutrin discontinued.  Past Psychiatric History: bipolar disorder  Risk to Self: Suicidal Ideation: No-Not Currently/Within Last 6 Months Suicidal Intent: No-Not Currently/Within Last 6 Months Is patient at risk for suicide?: No Suicidal Plan?: No-Not Currently/Within Last 6 Months Access to Means: No Specify Access to Suicidal Means: Pt denies having a plan  What has been your use of drugs/alcohol within the last 12 months?: Adderall, alcohola and marijuana.  How many times?: 0 Other Self Harm Risks: Pt denies  Triggers for Past Attempts: None known (No attempts reported. ) Intentional Self Injurious Behavior: None Risk to Others: Homicidal Ideation: No Thoughts of Harm to Others: No Current Homicidal Intent: No Current Homicidal Plan: No Access to Homicidal Means: No Identified Victim: N/A History of harm to others?: No Assessment of Violence: None Noted Violent Behavior Description: No violent behaviors observed. Pt is calm and cooperative at this time.  Does patient have access to weapons?: Yes (Comment) ("guns, rifles, shot guns and knives" ) Criminal Charges Pending?: Yes Describe Pending Criminal Charges: open container, expired registration  Does patient have a court date: Yes Court Date: 04/16/16 Prior Inpatient Therapy: Prior Inpatient Therapy: No Prior Outpatient Therapy: Prior Outpatient Therapy: Yes Prior Therapy Dates: 2017 Prior Therapy Facilty/Provider(s): Dr. Mayford Knife  Reason for Treatment: medication management  Does patient have an ACCT team?: No Does patient have Intensive In-House Services?  : No  Does patient have [facility] services? : No Does patient have P4CC services?: No  Past Medical History:  Past Medical History  Diagnosis Date  . Bipolar 2  disorder (HCC)   . Anxiety and depression    History reviewed. No pertinent past surgical history. Family History:  Family History  Problem Relation Age of Onset  . Depression Mother   . Cancer Maternal Grandmother   . Bipolar disorder Maternal Grandmother   . Lung cancer Paternal Grandmother   . Hyperlipidemia Father    Family Psychiatric  History: None Social History:  History  Alcohol Use  . 0.0 - 2.4 oz/week  . 0 Glasses of wine, 0-4 Cans of beer, 0 Shots of liquor per week    Comment: a fifth 3-4 times a week     History  Drug Use  . Yes  . Special: Marijuana    Comment: last used on 12-18-15    Social History   Social History  . Marital Status: Single    Spouse Name: N/A  . Number of Children: 0  . Years of Education: College   Occupational History  .  Other    [employer]   Social History Main Topics  . Smoking status: Current Every Day Smoker -- 0.25 packs/day    Types: Cigarettes    Start date: 12/25/2014  . Smokeless tobacco: Former Neurosurgeon    Types: Chew  . Alcohol Use: 0.0 - 2.4 oz/week    0 Glasses of wine, 0-4 Cans of beer, 0 Shots of liquor per week     Comment: a fifth 3-4 times a week  . Drug Use: Yes    Special: Marijuana     Comment: last used on 12-18-15  . Sexual Activity: Yes    Birth Control/ Protection: Condom   Other Topics Concern  . None   Social History Narrative   Patient lives on campus at [facility].   Caffeine Use: 2 cups daily   Additional Social History:    Allergies:   Allergies  Allergen Reactions  . Amoxicillin-Pot Clavulanate Nausea And Vomiting    Labs:  Results for orders placed or performed during the hospital encounter of 03/18/16 (from the past 48 hour(s))  Urine rapid drug screen (hosp performed) (Not at [facility])     Status: Abnormal   Collection Time: 03/19/16 12:18 AM  Result Value Ref Range   Opiates NONE DETECTED NONE DETECTED   Cocaine NONE DETECTED NONE DETECTED   Benzodiazepines NONE DETECTED  NONE DETECTED   Amphetamines NONE DETECTED NONE DETECTED   Tetrahydrocannabinol POSITIVE (A) NONE DETECTED   Barbiturates NONE DETECTED NONE DETECTED    Comment:        DRUG SCREEN FOR MEDICAL PURPOSES ONLY.  IF CONFIRMATION IS NEEDED FOR ANY PURPOSE, NOTIFY LAB WITHIN 5 DAYS.        LOWEST DETECTABLE LIMITS FOR URINE DRUG SCREEN Drug Class       Cutoff (ng/mL) Amphetamine      1000 Barbiturate      200 Benzodiazepine   200 Tricyclics       300 Opiates          300 Cocaine          300 THC              50   Comprehensive metabolic panel     Status: Abnormal   Collection Time: 03/19/16 12:43 AM  Result Value Ref Range   Sodium 140 135 - 145 mmol/L  Potassium 4.2 3.5 - 5.1 mmol/L   Chloride 106 101 - 111 mmol/L   CO2 21 (L) 22 - 32 mmol/L   Glucose, Bld 104 (H) 65 - 99 mg/dL   BUN 14 6 - 20 mg/dL   Creatinine, Ser 1.14 0.61 - 1.24 mg/dL   Calcium 9.4 8.9 - 10.3 mg/dL   Total Protein 7.9 6.5 - 8.1 g/dL   Albumin 5.0 3.5 - 5.0 g/dL   AST 18 15 - 41 U/L   ALT 24 17 - 63 U/L   Alkaline Phosphatase 62 38 - 126 U/L   Total Bilirubin 0.4 0.3 - 1.2 mg/dL   GFR calc non Af Amer >60 >60 mL/min   GFR calc Af Amer >60 >60 mL/min    Comment: (NOTE) The eGFR has been calculated using the CKD EPI equation. This calculation has not been validated in all clinical situations. eGFR's persistently <60 mL/min signify possible Chronic Kidney Disease.    Anion gap 13 5 - 15  Ethanol (ETOH)     Status: Abnormal   Collection Time: 03/19/16 12:43 AM  Result Value Ref Range   Alcohol, Ethyl (B) 123 (H) <5 mg/dL    Comment:        LOWEST DETECTABLE LIMIT FOR SERUM ALCOHOL IS 5 mg/dL FOR MEDICAL PURPOSES ONLY   Salicylate level     Status: None   Collection Time: 03/19/16 12:43 AM  Result Value Ref Range   Salicylate Lvl <2.2 2.8 - 30.0 mg/dL  Acetaminophen level     Status: Abnormal   Collection Time: 03/19/16 12:43 AM  Result Value Ref Range   Acetaminophen (Tylenol), Serum <10 (L)  10 - 30 ug/mL    Comment:        THERAPEUTIC CONCENTRATIONS VARY SIGNIFICANTLY. A RANGE OF 10-30 ug/mL MAY BE AN EFFECTIVE CONCENTRATION FOR MANY PATIENTS. HOWEVER, SOME ARE BEST TREATED AT CONCENTRATIONS OUTSIDE THIS RANGE. ACETAMINOPHEN CONCENTRATIONS >150 ug/mL AT 4 HOURS AFTER INGESTION AND >50 ug/mL AT 12 HOURS AFTER INGESTION ARE OFTEN ASSOCIATED WITH TOXIC REACTIONS.   CBC     Status: None   Collection Time: 03/19/16 12:43 AM  Result Value Ref Range   WBC 7.0 4.0 - 10.5 K/uL   RBC 4.89 4.22 - 5.81 MIL/uL   Hemoglobin 15.2 13.0 - 17.0 g/dL   HCT 42.9 39.0 - 52.0 %   MCV 87.7 78.0 - 100.0 fL   MCH 31.1 26.0 - 34.0 pg   MCHC 35.4 30.0 - 36.0 g/dL   RDW 12.4 11.5 - 15.5 %   Platelets 340 150 - 400 K/uL    Current Facility-Administered Medications  Medication Dose Route Frequency Provider Last Rate Last Dose  . alum & mag hydroxide-simeth (MAALOX/MYLANTA) 200-200-20 MG/5ML suspension 15 mL  15 mL Oral Q6H PRN Shanon Rosser, MD   15 mL at 03/19/16 0138  . citalopram (CELEXA) tablet 40 mg  40 mg Oral Daily Patrecia Pour, NP      . lamoTRIgine (LAMICTAL) tablet 100 mg  100 mg Oral Daily Patrecia Pour, NP   100 mg at 03/19/16 1311  . LORazepam (ATIVAN) tablet 0-4 mg  0-4 mg Oral 4 times per day Junius Creamer, NP   0 mg at 03/19/16 0148   Followed by  . [START ON 03/21/2016] LORazepam (ATIVAN) tablet 0-4 mg  0-4 mg Oral Q12H Junius Creamer, NP      . nicotine (NICODERM CQ - dosed in mg/24 hours) patch 21 mg  21 mg Transdermal Daily Baker Janus  Olean Ree, NP   21 mg at 03/19/16 0932  . ondansetron (ZOFRAN) tablet 4 mg  4 mg Oral Q8H PRN Junius Creamer, NP      . QUEtiapine (SEROQUEL) tablet 50 mg  50 mg Oral QHS Patrecia Pour, NP       Current Outpatient Prescriptions  Medication Sig Dispense Refill  . buPROPion (WELLBUTRIN XL) 150 MG 24 hr tablet Take 1 tablet (150 mg total) by mouth daily. 30 tablet 1  . citalopram (CELEXA) 40 MG tablet Take 1 tablet (40 mg total) by mouth daily. 90 tablet 1   . ibuprofen (ADVIL,MOTRIN) 200 MG tablet Take 600 mg by mouth every 6 (six) hours as needed for moderate pain.    Marland Kitchen lamoTRIgine (LAMICTAL) 100 MG tablet Take 100 mg by mouth daily with breakfast.    . lamoTRIgine (LAMICTAL) 150 MG tablet Take 1 tablet (150 mg total) by mouth daily. 90 tablet 1  . QUEtiapine (SEROQUEL) 50 MG tablet Take 1 tablet (50 mg total) by mouth at bedtime. 90 tablet 1    Musculoskeletal: Strength & Muscle Tone: within normal limits Gait & Station: normal Patient leans: N/A  Psychiatric Specialty Exam: Review of Systems  Constitutional: Negative.   HENT: Negative.   Eyes: Negative.   Respiratory: Negative.   Cardiovascular: Negative.   Gastrointestinal: Negative.   Genitourinary: Negative.   Musculoskeletal: Negative.   Skin: Negative.   Neurological: Negative.   Endo/Heme/Allergies: Negative.   Psychiatric/Behavioral: Positive for depression.    Blood pressure 117/66, pulse 103, temperature 97.9 F (36.6 C), temperature source Oral, resp. rate 18, height '5\' 9"'$  (1.753 m), SpO2 100 %.There is no weight on file to calculate BMI.  General Appearance: Disheveled  Eye Sport and exercise psychologist::  Fair  Speech:  Normal Rate  Volume:  Normal  Mood:  Anxious and Depressed  Affect:  Congruent  Thought Process:  Coherent  Orientation:  Full (Time, Place, and Person)  Thought Content:  Rumination  Suicidal Thoughts:  Yes.  with intent/plan  Homicidal Thoughts:  No  Memory:  Immediate;   Fair Recent;   Fair Remote;   Fair  Judgement:  Fair  Insight:  Fair  Psychomotor Activity:  Decreased  Concentration:  Fair  Recall:  [employer] of Knowledge:Fair  Language: Good  Akathisia:  No  Handed:  Right  AIMS (if indicated):     Assets:  Housing Leisure Time Physical Health Resilience Social Support  ADL's:  Intact  Cognition: WNL  Sleep:      Treatment Plan Summary: Daily contact with patient to assess and evaluate symptoms and progress in treatment, Medication  management and Plan bipolar affective disorder, most recent epidose depressed, severe without psychotic features:  -Crisis stabilization -Medication management:  Ativan alcohol detox protocol started, continued his Lamictal 100 mg daily for mood stabilization, Celexa 40 mg daily for depression, and Seroquel 50 mg at bedtime for sleep and mood stabilization.  Wellbutrin 150 mg daily for depression not continued due to increase in anxiety and not sleeping -Individual and substance abuse counseling  Disposition: Recommend psychiatric Inpatient admission when medically cleared.  Waylan Boga, NP 03/19/2016 4:53 PM Patient seen face-to-face for psychiatric evaluation, chart reviewed and case discussed with the physician extender and developed treatment plan. Reviewed the information documented and agree with the treatment plan. Corena Pilgrim, MD

## 2016-03-19 NOTE — ED Provider Notes (Signed)
CSN: [id number]     Arrival date & time 03/18/16  2302 History   First MD Initiated Contact with Patient 03/19/16 [phone]     Chief Complaint  Patient presents with  . Alcohol Problem    Pt stated he is an alcoholic and would like help   . Suicidal     (Consider location/radiation/quality/duration/timing/severity/associated sxs/prior Treatment) HPI Comments: Patient with ETOH abuse Hx drinks 3-4 times weekly when he does becomes Suicidal   Plans on shooting himself, has access to a gun   Patient is a 25 y.o. male presenting with alcohol problem. The history is provided by the patient.  Alcohol Problem This is a new problem. The current episode started more than 1 year ago. The problem occurs constantly. The problem has been unchanged. Pertinent negatives include no chest pain, fever or headaches. Nothing aggravates the symptoms. He has tried nothing for the symptoms. The treatment provided no relief.    Past Medical History  Diagnosis Date  . Bipolar 2 disorder (HCC)   . Anxiety and depression    History reviewed. No pertinent past surgical history. Family History  Problem Relation Age of Onset  . Depression Mother   . Cancer Maternal Grandmother   . Bipolar disorder Maternal Grandmother   . Lung cancer Paternal Grandmother   . Hyperlipidemia Father    Social History  Substance Use Topics  . Smoking status: Current Every Day Smoker -- 0.25 packs/day    Types: Cigarettes    Start date: 12/25/2014  . Smokeless tobacco: Former NeurosurgeonUser    Types: Chew  . Alcohol Use: 0.0 - 2.4 oz/week    0 Glasses of wine, 0-4 Cans of beer, 0 Shots of liquor per week     Comment: a fifth 3-4 times a week    Review of Systems  Constitutional: Negative for fever.  Respiratory: Negative for shortness of breath.   Cardiovascular: Negative for chest pain.  Neurological: Negative for dizziness and headaches.  Psychiatric/Behavioral: Positive for suicidal ideas.  All other systems reviewed and are  negative.     Allergies  Amoxicillin-pot clavulanate  Home Medications   Prior to Admission medications   Medication Sig Start Date End Date Taking? Authorizing Provider  buPROPion (WELLBUTRIN XL) 150 MG 24 hr tablet Take 1 tablet (150 mg total) by mouth daily. 12/26/15  Yes Kerin SalenAlton L Williams, MD  citalopram (CELEXA) 40 MG tablet Take 1 tablet (40 mg total) by mouth daily. 12/26/15  Yes Kerin SalenAlton L Williams, MD  ibuprofen (ADVIL,MOTRIN) 200 MG tablet Take 600 mg by mouth every 6 (six) hours as needed for moderate pain.   Yes Historical Provider, MD  lamoTRIgine (LAMICTAL) 150 MG tablet Take 1 tablet (150 mg total) by mouth daily. 12/26/15  Yes Kerin SalenAlton L Williams, MD  QUEtiapine (SEROQUEL) 50 MG tablet Take 1 tablet (50 mg total) by mouth at bedtime. 12/26/15  Yes Kerin SalenAlton L Williams, MD   BP 117/70 mmHg  Pulse 98  Temp(Src) 98.4 F (36.9 C) (Oral)  Resp 20  Ht 5\' 9"  (1.753 m)  SpO2 97% Physical Exam  Constitutional: He appears well-developed and well-nourished.  HENT:  Head: Normocephalic.  Eyes: Pupils are equal, round, and reactive to light.  Neck: Normal range of motion.  Cardiovascular: Normal rate.   Pulmonary/Chest: Effort normal.  Abdominal: Soft.  Musculoskeletal: Normal range of motion.  Neurological: He is alert.  Skin: Skin is warm.  Psychiatric: His speech is normal and behavior is normal. Cognition and memory are normal. He  expresses impulsivity. He exhibits a depressed mood. He expresses suicidal ideation. He expresses suicidal plans.  Nursing note and vitals reviewed.   ED Course  Procedures (including critical care time) Labs Review Labs Reviewed  COMPREHENSIVE METABOLIC PANEL - Abnormal; Notable for the following:    CO2 21 (*)    Glucose, Bld 104 (*)    All other components within normal limits  ETHANOL - Abnormal; Notable for the following:    Alcohol, Ethyl (B) 123 (*)    All other components within normal limits  ACETAMINOPHEN LEVEL - Abnormal; Notable for  the following:    Acetaminophen (Tylenol), Serum <10 (*)    All other components within normal limits  URINE RAPID DRUG SCREEN, HOSP PERFORMED - Abnormal; Notable for the following:    Tetrahydrocannabinol POSITIVE (*)    All other components within normal limits  SALICYLATE LEVEL  CBC    Imaging Review No results found. I have personally reviewed and evaluated these images and lab results as part of my medical decision-making.   EKG Interpretation None     Will have TTS evaluation  Patient has been evaluated TTS he meets criteria for admission.  There actively seeking bed placement  MDM   Final diagnoses:  Polysubstance abuse  Suicidal ideation         Earley Favor, NP 03/19/16 0105  Earley Favor, NP 03/19/16 0214  Paula Libra, MD 03/19/16 [phone]

## 2016-03-19 NOTE — ED Notes (Signed)
Pt states that he needs help with Alcohol; pt states that he drinks 3-4 days a week and when he drinks he binge drinks; pt states that he drinks a fifth + on those days; pt states that he has thoughts of suicide with a plan to shoot himself; pt states that he also has been using marijuana consistently; pt states that he has used other drugs but not consistently

## 2016-03-19 NOTE — Tx Team (Signed)
Initial Interdisciplinary Treatment Plan   PATIENT STRESSORS: Educational concerns Financial difficulties Legal issue Substance abuse   PATIENT STRENGTHS: Average or above average intelligence Communication skills General fund of knowledge   PROBLEM LIST: Problem List/Patient Goals Date to be addressed Date deferred Reason deferred Estimated date of resolution  Depression 03/19/16     Substance abuse 03/19/16     Suicidal ideation 03/19/16     "Stop drinking" 03/19/16     "Overhaul my mental hygiene" 03/19/16                              DISCHARGE CRITERIA:  Need for constant or close observation no longer present Verbal commitment to aftercare and medication compliance  PRELIMINARY DISCHARGE PLAN: Outpatient therapy Medication management  PATIENT/FAMIILY INVOLVEMENT: This treatment plan has been presented to and reviewed with the patient, Jerry Mccarthy.  The patient and family have been given the opportunity to ask questions and make suggestions.  Norm ParcelHeather V Burlin Mcnair 03/19/2016, 9:12 PM

## 2016-03-19 NOTE — [facility] Group Notes (Signed)
Pt attended AA group.  Darnisha Vernet, MHT 

## 2016-03-19 NOTE — Progress Notes (Signed)
Jerry Mccarthy is a 25 year old male being admitted voluntarily to [facility] from [facility] for intermittent suicidal ideations and wanting help with his substance abuse.  He reports that he is using alcohol, marijuana and Adderall.  He currently denies SI but had plan prior to coming to the ED to shoot self.  He endorses multiple stressors including legal issues, financial issues and relationship problems.  He denies HI or A/V hallucinations.  No history of psychiatric hospitalizations.  He denies any current SI at this time.  Admission paperwork completed and signed.  Belongings searched and secured in locker # 37 (black duffle bag, hooded jacket with strings, gray shoe strings, 2 box marlboro, grey earrings, cell phone, debit card, ID, student ID).  Skin assessment completed and noted tattoo on left shoulder.  Q 15 minute checks initiated for safety.  We will monitor the progress towards his goals.

## 2016-03-19 NOTE — ED Notes (Signed)
Pt has been seen by security and wanded.  Pt has 1 bag and 1 duffle bag.

## 2016-03-20 ENCOUNTER — Other Ambulatory Visit: Payer: Self-pay

## 2016-03-20 ENCOUNTER — Encounter (HOSPITAL_COMMUNITY): Payer: Self-pay | Admitting: Psychiatry

## 2016-03-20 DIAGNOSIS — F314 Bipolar disorder, current episode depressed, severe, without psychotic features: Principal | ICD-10-CM

## 2016-03-20 DIAGNOSIS — F1010 Alcohol abuse, uncomplicated: Secondary | ICD-10-CM

## 2016-03-20 MED ORDER — ARIPIPRAZOLE 2 MG PO TABS
2.0000 mg | ORAL_TABLET | Freq: Every day | ORAL | Status: DC
  Administered 2016-03-20 – 2016-03-21 (×2): 2 mg via ORAL
  Filled 2016-03-20 (×4): qty 1

## 2016-03-20 MED ORDER — TRAZODONE HCL 50 MG PO TABS
50.0000 mg | ORAL_TABLET | Freq: Every evening | ORAL | Status: DC | PRN
  Administered 2016-03-20 – 2016-03-21 (×2): 50 mg via ORAL
  Filled 2016-03-20 (×2): qty 1

## 2016-03-20 NOTE — Progress Notes (Signed)
Pt signed 72 hour request for discharge 03/19/16 at 1843.  Paperwork placed on the front of the chart.

## 2016-03-20 NOTE — Progress Notes (Signed)
[facility] Group Notes:  (Nursing/MHT/Case Management/Adjunct)  Date:  03/20/2016  Time:  2100 Type of Therapy:  wrap up group  Participation Level:  Active  Participation Quality:  Appropriate, Attentive, Sharing and Supportive  Affect:  Appropriate  Cognitive:  Appropriate  Insight:  Improving  Engagement in Group:  Engaged  Modes of Intervention:  Clarification, Education and Support  Summary of Progress/Problems: Pt shared that he has many opportunities in his life that he has messed up because of alcohol and drugs. Pt plans on going home, continuing with outpatient treatment, and start going to AA for his first time.   Marcille BuffyMcNeil, Brittney Caraway S 03/20/2016, 9:44 PM

## 2016-03-20 NOTE — [facility] Group Notes (Signed)
[facility] Group Notes:  (Nursing/MHT/Case Management/Adjunct)  Date:  03/20/2016  Time:  2:31 PM  Type of Therapy:  Nurse Education  Participation Level:  Active  Participation Quality:  Appropriate and Attentive  Affect:  Appropriate  Cognitive:  Alert and Appropriate  Insight:  Appropriate and Good  Engagement in Group:  Engaged and Improving  Modes of Intervention:  Activity, Discussion and Education  Summary of Progress/Problems: Topic was on healthy coping skills.  Discussed the importance of learning new healthy coping skills before discharging from the hospital.  Group encouraged to continue to utilize these skills even after discharge from the hospital.  Patient was attentive and receptive.      Jerry Mccarthy 03/20/2016, 2:31 PM

## 2016-03-20 NOTE — [facility] Suicide Risk Assessment (Signed)
[facility] Admission Suicide Risk Assessment   [facility]:  Patient Demographic factors:  Male, Caucasian, Adolescent or young adult Current Mental Status:  Self-harm thoughts Loss Factors:  Decrease in vocational status, Legal issues, Financial problems / change in socioeconomic status Historical Factors:  NA Risk Reduction Factors:  Positive social support, Living with another person, especially a relative  Total Time spent with patient: 45 minutes Principal Problem: Bipolar affective disorder, depressed, severe (HCC) Diagnosis:   Patient Active Problem List   Diagnosis Date Noted  . Alcohol abuse [F10.10] 03/19/2016  . Bipolar affective disorder, depressed, severe (HCC) [F31.4] 03/19/2016  . Affective psychosis, bipolar (HCC) [F31.9]   . Polysubstance abuse [F19.10]   . ADD (attention deficit disorder) [F90.9] 06/09/2015  . Anxiety [F41.9] 06/09/2015  . Bipolar affect, depressed (HCC) [F31.30] 06/09/2015  . Clinical depression [F32.9] 06/09/2015  . Headache, migraine [G43.909] 06/09/2015  . Cannot sleep [G47.00] 06/09/2015  . Drug-related disorder (HCC) [F19.99] 06/09/2015  . Seizure (HCC) [R56.9] 06/09/2015   Subjective Data: see admission H and P  Continued Clinical Symptoms:  Alcohol Use Disorder Identification Test Final Score (AUDIT): 28 The "Alcohol Use Disorders Identification Test", Guidelines for Use in Primary Care, Second Edition.  World Science writerHealth Organization [employer](WHO). Score between 0-7:  no or low risk or alcohol related problems. Score between 8-15:  moderate risk of alcohol related problems. Score between 16-19:  high risk of alcohol related problems. Score 20 or above:  warrants further diagnostic evaluation for alcohol dependence and treatment.   CLINICAL FACTORS:   Bipolar Disorder:   Depressive phase Alcohol/Substance Abuse/Dependencies  Psychiatric Specialty Exam: ROS  Blood pressure 122/65, pulse 75, temperature 97.9 F (36.6 C),  temperature source Oral, resp. rate 18, height 5\' 9"  (1.753 m), weight 75.297 kg (166 lb), SpO2 100 %.Body mass index is 24.5 kg/(m^2).   COGNITIVE FEATURES THAT CONTRIBUTE TO RISK:  Closed-mindedness, Polarized thinking and Thought constriction (tunnel vision)    SUICIDE RISK:   Moderate:  Frequent suicidal ideation with limited intensity, and duration, some specificity in terms of plans, no associated intent, good self-control, limited dysphoria/symptomatology, some risk factors present, and identifiable protective factors, including available and accessible social support.  PLAN OF CARE: see admission H and P  I certify that inpatient services furnished can reasonably be expected to improve the patient's condition.   Rachael FeeLUGO,Bobbiejo Ishikawa A, MD 03/20/2016, 5:37 PM
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
## 2016-03-20 NOTE — Progress Notes (Signed)
D:Patient in the dayroom on approach.  Patient states he is hear because he wants help with alcohol abuse.  Patient does appear to have insight.  Patient states he wants to get his life together.  Patient states he has a supportive family.  Patient denies SI/HI and denies AVH. A: Staff to monitor Q 15 mins for safety.  Encouragement and support offered.  Scheduled medications administered per orders.  Patient refused scheduled Ativan stating he is not withdrawing drom ETOH at this time.  Risks explained to patient. R: Patient remains safe on the unit.  Patient attended group tonight.  Patient visible on the unit and interacting with peers.  Patient taking administered medications.

## 2016-03-20 NOTE — H&P (Signed)
Psychiatric Admission Assessment Adult  Patient Identification: Jerry Mccarthy MRN:  [id number] Date of Evaluation:  03/20/2016 Chief Complaint:  BIPOLAR  ETOH Principal Diagnosis: <principal problem not specified> Diagnosis:   Patient Active Problem List   Diagnosis Date Noted  . Alcohol abuse [F10.10] 03/19/2016  . Bipolar affective disorder, depressed, severe (HCC) [F31.4] 03/19/2016  . Affective psychosis, bipolar (HCC) [F31.9]   . Polysubstance abuse [F19.10]   . ADD (attention deficit disorder) [F90.9] 06/09/2015  . Anxiety [F41.9] 06/09/2015  . Bipolar affect, depressed (HCC) [F31.30] 06/09/2015  . Clinical depression [F32.9] 06/09/2015  . Headache, migraine [G43.909] 06/09/2015  . Cannot sleep [G47.00] 06/09/2015  . Drug-related disorder (HCC) [F19.99] 06/09/2015  . Seizure (HCC) [R56.9] 06/09/2015   History of Present Illness:: 25 Y/O male who states he has been drinking a lot of alcohol. Has been drinking  a pint to a fith of liquor 3-4 times a week for a year and a half. Used to drink socially one night every other week. States he has been dealing  with a lot of stress. As the stress increased, he drank more.  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 Few weeks ago decided he had to quit  to take care of  himself The initial assessment is as follows Jerry Mccarthy is an 25 y.o. male presenting to [facility] reporting intermittent suicidal ideations and requesting help for his substance use. Pt stated "I am really dependent on alcohol and I am trying to get better". Pt denies SI at this time but it has been documented that when pt presented to the ED he was suicidal with a plan to shoot himself. Pt is at risk for suicide due to multiple stressors: legal issues, financial and relationship problems. Pt also has access to weapons and a history of depression. Pt denies HI and AVH  at this time. Pt reported that he is currently receiving medication management but did not report any psychiatric hospitalizations. 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 Pt denies physical, sexual and emotional abuse.    Associated Signs/Symptoms: Depression Symptoms:  depressed mood, anhedonia, insomnia, fatigue, difficulty concentrating, suicidal thoughts without plan, anxiety, panic attacks, loss of energy/fatigue, disturbed sleep, weight loss, (Hypo) Manic Symptoms:  Labiality of Mood, Anxiety Symptoms:  Excessive Worry, Psychotic Symptoms:  denies PTSD Symptoms: Negative Total Time spent with patient: 45 minutes  Past Psychiatric History:   Is the patient at risk to self? No.  Has the patient been a risk to self in the past 6 months? No.  Has the patient been a risk to self within the distant past? No.  Is the patient a risk to others? No.  Has the patient been a risk to others in the past 6 months? No.  Has the patient been a risk to others within the distant past? No.   Prior Inpatient Therapy:  Denies  Prior Outpatient Therapy:  [facility] Jerry Mccarthy) uses citalopram 40 Seroquel 100 Dr. Mayford Knife Lamictal 150 Wellbutrin Fall 2015 first seizure one more March 2015 Abilify stomach upset anxious   Alcohol Screening: 1. How often do you have a drink containing alcohol?: 4 or more times a week 2. How many drinks containing alcohol do you have on a typical day when you are drinking?: 7, 8, or 9 3. How often do you have six or more drinks on one  occasion?: Weekly Preliminary Score: 6 4. How often during the last year have you found that you were not able to stop drinking once you had started?: Weekly 5. How often during the last year have you failed to do what was normally expected from you becasue of drinking?: Weekly 6. How often during the last year have you needed a first drink in the morning to get yourself going after a heavy drinking session?:  Weekly 7. How often during the last year have you had a feeling of guilt of remorse after drinking?: Weekly 8. How often during the last year have you been unable to remember what happened the night before because you had been drinking?: Monthly 9. Have you or someone else been injured as a result of your drinking?: No 10. Has a relative or friend or a doctor or another health worker been concerned about your drinking or suggested you cut down?: Yes, during the last year Alcohol Use Disorder Identification Test Final Score (AUDIT): 28 Brief Intervention: Yes Substance Abuse History in the last 12 months:  Yes.   Consequences of Substance Abuse: Legal Consequences:  opne container Previous Psychotropic Medications: Yes  Psychological Evaluations: No  Past Medical History:  Past Medical History  Diagnosis Date  . Bipolar 2 disorder ([place])   . Anxiety and depression    History reviewed. No pertinent past surgical history. Family History:  Family History  Problem Relation Age of Onset  . Depression Mother   . Cancer Maternal Grandmother   . Bipolar disorder Maternal Grandmother   . Lung cancer Paternal Grandmother   . Hyperlipidemia Father    Family Psychiatric  History: mother citalopram, grandmother  Tobacco Screening: '@FLOW'$ ([phone])::1)@ Social History:  History  Alcohol Use  . 0.0 - 2.4 oz/week  . 0 Glasses of wine, 0-4 Cans of beer, 0 Shots of liquor per week    Comment: a fifth 3-4 times a week     History  Drug Use  . Yes  . 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  Over the Counter: None History of alcohol / drug use?: Yes Longest period of sobriety (when/how long): "Months" Negative Consequences of Use: Financial, Legal, Personal  relationships, Work / Youth worker Withdrawal Symptoms: Other (Comment) (None voiced at this time) Name of Substance 1: Alcohol  1 - Age of First Use: 20 1 - Amount (size/oz): "1 pint up to a 1/5"  1 - Frequency: 3-4 times per week 1 - Duration: ongoing 1 - Last Use / Amount: last night Name of Substance 2: THC  2 - Amount (size/oz): varies  2 - Last Use / Amount: 03-13-16 Name of Substance 3: Adderall  3 - Age of First Use: 23 3 - Amount (size/oz): 6-'20mg'$   3 - Frequency: "I wasn't consistant" 3 - Duration: unknown 3 - Last Use / Amount: Feb 2017              Allergies:   Allergies  Allergen Reactions  . Amoxicillin-Pot Clavulanate Nausea And Vomiting   Lab Results:  Results for orders placed or performed during the hospital encounter of 03/18/16 (from the past 48 hour(s))  Urine rapid drug screen (hosp performed) (Not at [facility])     Status: Abnormal   Collection  Time: 03/19/16 12:18 AM  Result Value Ref Range   Opiates NONE DETECTED NONE DETECTED   Cocaine NONE DETECTED NONE DETECTED   Benzodiazepines NONE DETECTED NONE DETECTED   Amphetamines NONE DETECTED NONE DETECTED   Tetrahydrocannabinol POSITIVE (A) NONE DETECTED   Barbiturates NONE DETECTED NONE DETECTED    Comment:        DRUG SCREEN FOR MEDICAL PURPOSES ONLY.  IF CONFIRMATION IS NEEDED FOR ANY PURPOSE, NOTIFY LAB WITHIN 5 DAYS.        LOWEST DETECTABLE LIMITS FOR URINE DRUG SCREEN Drug Class       Cutoff (ng/mL) Amphetamine      1000 Barbiturate      200 Benzodiazepine   527 Tricyclics       782 Opiates          300 Cocaine          300 THC              50   Comprehensive metabolic panel     Status: Abnormal   Collection Time: 03/19/16 12:43 AM  Result Value Ref Range   Sodium 140 135 - 145 mmol/L   Potassium 4.2 3.5 - 5.1 mmol/L   Chloride 106 101 - 111 mmol/L   CO2 21 (L) 22 - 32 mmol/L   Glucose, Bld 104 (H) 65 - 99 mg/dL   BUN 14 6 - 20 mg/dL   Creatinine, Ser 1.14 0.61 - 1.24 mg/dL   Calcium  9.4 8.9 - 10.3 mg/dL   Total Protein 7.9 6.5 - 8.1 g/dL   Albumin 5.0 3.5 - 5.0 g/dL   AST 18 15 - 41 U/L   ALT 24 17 - 63 U/L   Alkaline Phosphatase 62 38 - 126 U/L   Total Bilirubin 0.4 0.3 - 1.2 mg/dL   GFR calc non Af Amer >60 >60 mL/min   GFR calc Af Amer >60 >60 mL/min    Comment: (NOTE) The eGFR has been calculated using the CKD EPI equation. This calculation has not been validated in all clinical situations. eGFR's persistently <60 mL/min signify possible Chronic Kidney Disease.    Anion gap 13 5 - 15  Ethanol (ETOH)     Status: Abnormal   Collection Time: 03/19/16 12:43 AM  Result Value Ref Range   Alcohol, Ethyl (B) 123 (H) <5 mg/dL    Comment:        LOWEST DETECTABLE LIMIT FOR SERUM ALCOHOL IS 5 mg/dL FOR MEDICAL PURPOSES ONLY   Salicylate level     Status: None   Collection Time: 03/19/16 12:43 AM  Result Value Ref Range   Salicylate Lvl <4.2 2.8 - 30.0 mg/dL  Acetaminophen level     Status: Abnormal   Collection Time: 03/19/16 12:43 AM  Result Value Ref Range   Acetaminophen (Tylenol), Serum <10 (L) 10 - 30 ug/mL    Comment:        THERAPEUTIC CONCENTRATIONS VARY SIGNIFICANTLY. A RANGE OF 10-30 ug/mL MAY BE AN EFFECTIVE CONCENTRATION FOR MANY PATIENTS. HOWEVER, SOME ARE BEST TREATED AT CONCENTRATIONS OUTSIDE THIS RANGE. ACETAMINOPHEN CONCENTRATIONS >150 ug/mL AT 4 HOURS AFTER INGESTION AND >50 ug/mL AT 12 HOURS AFTER INGESTION ARE OFTEN ASSOCIATED WITH TOXIC REACTIONS.   CBC     Status: None   Collection Time: 03/19/16 12:43 AM  Result Value Ref Range   WBC 7.0 4.0 - 10.5 K/uL   RBC 4.89 4.22 - 5.81 MIL/uL   Hemoglobin 15.2 13.0 - 17.0 g/dL   HCT 42.9  39.0 - 52.0 %   MCV 87.7 78.0 - 100.0 fL   MCH 31.1 26.0 - 34.0 pg   MCHC 35.4 30.0 - 36.0 g/dL   RDW 12.4 11.5 - 15.5 %   Platelets 340 150 - 400 K/uL    Blood Alcohol level:  Lab Results  Component Value Date   [facility] 123* 62/95/2841    Metabolic Disorder Labs:  No results found for:  HGBA1C, MPG No results found for: PROLACTIN No results found for: CHOL, TRIG, HDL, CHOLHDL, VLDL, LDLCALC  Current Medications: Current Facility-Administered Medications  Medication Dose Route Frequency Provider Last Rate Last Dose  . acetaminophen (TYLENOL) tablet 650 mg  650 mg Oral Q6H PRN Patrecia Pour, NP      . alum & mag hydroxide-simeth (MAALOX/MYLANTA) 200-200-20 MG/5ML suspension 30 mL  30 mL Oral Q4H PRN Patrecia Pour, NP      . citalopram (CELEXA) tablet 40 mg  40 mg Oral Daily Patrecia Pour, NP   40 mg at 03/20/16 0754  . lamoTRIgine (LAMICTAL) tablet 100 mg  100 mg Oral Daily Patrecia Pour, NP   100 mg at 03/20/16 0754  . LORazepam (ATIVAN) tablet 0-4 mg  0-4 mg Oral 4 times per day Patrecia Pour, NP   0 mg at 03/19/16 1900   Followed by  . [START ON 03/21/2016] LORazepam (ATIVAN) tablet 0-4 mg  0-4 mg Oral Q12H Patrecia Pour, NP      . magnesium hydroxide (MILK OF MAGNESIA) suspension 30 mL  30 mL Oral Daily PRN Patrecia Pour, NP      . nicotine polacrilex (NICORETTE) gum 2 mg  2 mg Oral PRN Nicholaus Bloom, MD      . ondansetron [facility]) tablet 4 mg  4 mg Oral Q8H PRN Patrecia Pour, NP      . QUEtiapine (SEROQUEL) tablet 50 mg  50 mg Oral QHS Patrecia Pour, NP   50 mg at 03/19/16 2218   PTA Medications: Prescriptions prior to admission  Medication Sig Dispense Refill Last Dose  . citalopram (CELEXA) 40 MG tablet Take 1 tablet (40 mg total) by mouth daily. 90 tablet 1 03/17/2016 at Unknown time  . ibuprofen (ADVIL,MOTRIN) 200 MG tablet Take 600 mg by mouth every 6 (six) hours as needed for moderate pain.   03/18/2016 at Unknown time  . lamoTRIgine (LAMICTAL) 100 MG tablet Take 100 mg by mouth daily with breakfast.     . lamoTRIgine (LAMICTAL) 150 MG tablet Take 1 tablet (150 mg total) by mouth daily. 90 tablet 1 03/17/2016 at 2300  . QUEtiapine (SEROQUEL) 50 MG tablet Take 1 tablet (50 mg total) by mouth at bedtime. 90 tablet 1 03/17/2016 at Unknown time     Musculoskeletal: Strength & Muscle Tone: within normal limits Gait & Station: normal Patient leans: normal  Psychiatric Specialty Exam: Physical Exam  Review of Systems  Constitutional: Positive for malaise/fatigue.  HENT: Negative.   Eyes: Negative.   Respiratory:       Half a pack a day  Cardiovascular: Negative.   Gastrointestinal: Positive for heartburn.  Genitourinary: Negative.   Musculoskeletal: Positive for joint pain.  Skin: Negative.   Neurological: Negative.   Endo/Heme/Allergies: Negative.   Psychiatric/Behavioral: Positive for depression, suicidal ideas and substance abuse. The patient is nervous/anxious and has insomnia.     Blood pressure 118/82, pulse 86, temperature 97.9 F (36.6 C), temperature source Oral, resp. rate 18, height '5\' 9"'$  (1.753 m), weight  75.297 kg (166 lb), SpO2 100 %.Body mass index is 24.5 kg/(m^2).  General Appearance: Fairly Groomed  Engineer, water::  Fair  Speech:  Clear and Coherent  Volume:  Normal  Mood:  Anxious, Depressed and Dysphoric  Affect:  Restricted  Thought Process:  Coherent and Goal Directed  Orientation:  Full (Time, Place, and Person)  Thought Content:  symptoms events worries concerns  Suicidal Thoughts:  No  Homicidal Thoughts:  No  Memory:  Immediate;   Fair Recent;   Fair Remote;   Fair  Judgement:  Fair  Insight:  Present and Shallow  Psychomotor Activity:  Restlessness  Concentration:  Fair  Recall:  [place]  Language: Fair  Akathisia:  No  Handed:  Right  AIMS (if indicated):     Assets:  Desire for Improvement Housing Social Support  ADL's:  Intact  Cognition: WNL  Sleep:  Number of Hours: 6.25     Treatment Plan Summary: Daily contact with patient to assess and evaluate symptoms and progress in treatment and Medication management Supportive approach/coping skills Alcohol dependence: Ativan detox protocol/work a relapse prevention plan Mood instability; states that the  Seroquel makes him very sedated. Willing to  resume the Abilify that he took for few days and give it a longer therapeutic trial, and optimize dose response. Continue Lamictal 100 mg what he takes for mood instability and mood stabilization  Depression; continue the Celexa 40 mg daily Insomnia: Trazodone 50 mg HS PRN sleep Work with CBT/mindfulness Observation Level/Precautions:  15 minute checks  Laboratory:  As per the ED  Psychotherapy:  Individual/group  Medications:  Will detox with Ativan, address his mood instability  Consultations:    Discharge Concerns:    Estimated LOS: 3-5 days  Other:     I certify that inpatient services furnished can reasonably be expected to improve the patient's condition.    Nicholaus Bloom, MD 4/8/201711:13 AM

## 2016-03-20 NOTE — Progress Notes (Signed)
D.  Pt pleasant on approach, denies complaints at this time.  Positive for evening wrap up group, interacting appropriately with peers on the unit.  Denies SI/HI/hallucinations at this time.  A.  Support and encouragement offered.  Medication given as ordered  R.  Pt remains safe on the unit.  Will continue to monitor.

## 2016-03-20 NOTE — Progress Notes (Signed)
DAR NOTE: Patient is calm and pleasant.  Mood and affect is bright.  Denies pain, auditory and visual hallucinations.  Rates depression at 2-3, hopelessness at 1-2, and anxiety at 7.  Describes energy level as normal and concentration as good.  Maintained on routine safety checks.  No withdrawal symptoms noted or reported.  Medications given as prescribed.  Support and encouragement offered as needed.  Attended group and participated.  States goal for today is "learn as much as I can about treatment and therapy options to prevent relapse and drinking again."  Patient observed socializing with peers in the dayroom.  Offered no complaint.

## 2016-03-20 NOTE — [facility] Group Notes (Signed)
[facility] Group Notes:  (Clinical Social Work)   10/11/2015     10:00-11:00AM  Summary of Progress/Problems:   In today's process group a decisional balance exercise was used to explore in depth the perceived benefits and costs of alcohol and drugs, as well as the  benefits and costs of replacing these with healthy coping skills.  Patients listed healthy and unhealthy coping techniques, determining with CSW guidance that unhealthy coping techniques work initially, but eventually become harmful.  Motivational Interviewing and the whiteboard were utilized for the exercises.  The patient expressed that the unhealthy coping he often uses is alcohol and negative thinking about himself.  He participated in group by listening carefully and making a few contributions to the discussion.  Type of Therapy:  Group Therapy - Process   Participation Level:  Active  Participation Quality:  Attentive  Affect:  Blunted  Cognitive:  Oriented  Insight:  Developing/Improving  Engagement in Therapy:  Engaged  Modes of Intervention:  Education, Motivational Interviewing  Jerry MantleMareida Grossman-Orr, LCSW 03/20/2016, 12:14 PM

## 2016-03-21 LAB — LIPID PANEL
Cholesterol: 302 mg/dL — ABNORMAL HIGH (ref 0–200)
HDL: 41 mg/dL (ref >40)
LDL Cholesterol: 194 mg/dL — ABNORMAL HIGH (ref 0–99)
TRIGLYCERIDES: 337 mg/dL — AB (ref <150)
Total CHOL/HDL Ratio: 7.4 RATIO
VLDL: 67 mg/dL — ABNORMAL HIGH (ref 0–40)

## 2016-03-21 MED ORDER — ARIPIPRAZOLE 5 MG PO TABS
5.0000 mg | ORAL_TABLET | Freq: Every day | ORAL | Status: DC
  Administered 2016-03-22: 5 mg via ORAL
  Filled 2016-03-21 (×4): qty 1

## 2016-03-21 NOTE — Progress Notes (Signed)
D: Patient is alert. Pt's mood and affect is appropriate to circumstance and pleasant upon interaction. Pt denies SI/HI and AVH. Pt is hypertensive and slighty bradycardic upon sitting BP and pulse, denies symptoms, BP recheck later was WDL. Pt denies having any withdrawal symptoms, and therefore refused 0800 Ativan dose. Pt rates depression and hopelessness both 2/10 and anxiety 5/10. Pt reports his goal for the day is "continue to learn from classes to prepare for staying sober when I leave." Pt complains of nicotine craving/smoking cessation. Pt is attending unit groups.  A: Will monitor BP and pulse. Active listening by RN. Encouragement/Support provided to pt. PRN medication administered for smoking cessation per providers orders (See MAR). Scheduled medications administered per providers orders (See MAR). 15 minute checks continued per protocol for patient safety.  R: Patient cooperative and receptive to nursing interventions. Pt remains safe.

## 2016-03-21 NOTE — Progress Notes (Signed)
D.  Pt pleasant on approach, no complaints voiced.  Positive for evening AA group, interacting appropriately with peers on the unit.  Denies SI/HI/hallucinations at this time.  Pt looking forward to discharge tomorrow.  A.  Support and encouragement offered.  R. Pt remains safe, will continue to monitor.

## 2016-03-21 NOTE — Progress Notes (Signed)
[facility] MD Progress Note  03/21/2016 8:48 PM Jerry Mccarthy  MRN:  [id number] Subjective:  Jerry Mccarthy states he is more clear headed now and that he is not planning to go back to using. States that his grandmother is agreeable for him to go back but he needs to comply with her expectation of no drinking or using drugs. He states he is OK with it as he wants to get his life back together and will be easier staying with her.  Principal Problem: Bipolar affective disorder, depressed, severe (HCC) Diagnosis:   Patient Active Problem List   Diagnosis Date Noted  . Alcohol abuse [F10.10] 03/19/2016  . Bipolar affective disorder, depressed, severe (HCC) [F31.4] 03/19/2016  . Affective psychosis, bipolar (HCC) [F31.9]   . Polysubstance abuse [F19.10]   . ADD (attention deficit disorder) [F90.9] 06/09/2015  . Anxiety [F41.9] 06/09/2015  . Bipolar affect, depressed (HCC) [F31.30] 06/09/2015  . Clinical depression [F32.9] 06/09/2015  . Headache, migraine [G43.909] 06/09/2015  . Cannot sleep [G47.00] 06/09/2015  . Drug-related disorder (HCC) [F19.99] 06/09/2015  . Seizure (HCC) [R56.9] 06/09/2015   Total Time spent with patient: 20 minutes  Past Psychiatric History: see admission H and P  Past Medical History:  Past Medical History  Diagnosis Date  . Bipolar 2 disorder (HCC)   . Anxiety and depression    History reviewed. No pertinent past surgical history. Family History:  Family History  Problem Relation Age of Onset  . Depression Mother   . Cancer Maternal Grandmother   . Bipolar disorder Maternal Grandmother   . Lung cancer Paternal Grandmother   . Hyperlipidemia Father    Family Psychiatric  History: see admission H and P Social History:  History  Alcohol Use  . 0.0 - 2.4 oz/week  . 0 Glasses of wine, 0-4 Cans of beer, 0 Shots of liquor per week    Comment: Mccarthy fifth 3-4 times Mccarthy week     History  Drug Use  . Yes  . Special: Marijuana    Comment: last used on 12-18-15    Social  History   Social History  . Marital Status: Single    Spouse Name: N/Mccarthy  . Number of Children: 0  . Years of Education: College   Occupational History  .  Other    [employer]   Social History Main Topics  . Smoking status: Current Every Day Smoker -- 0.25 packs/day    Types: Cigarettes    Start date: 12/25/2014  . Smokeless tobacco: Former Neurosurgeon    Types: Chew  . Alcohol Use: 0.0 - 2.4 oz/week    0 Glasses of wine, 0-4 Cans of beer, 0 Shots of liquor per week     Comment: Mccarthy fifth 3-4 times Mccarthy week  . Drug Use: Yes    Special: Marijuana     Comment: last used on 12-18-15  . Sexual Activity: Yes    Birth Control/ Protection: Condom   Other Topics Concern  . None   Social History Narrative   Patient lives on campus at [facility].   Caffeine Use: 2 cups daily   Additional Social History:    Pain Medications: None Prescriptions: Pt reported that he has abused Adderal in the past.  Over the Counter: None History of alcohol / drug use?: Yes Longest period of sobriety (when/how long): "Months" Negative Consequences of Use: Financial, Legal, Personal relationships, Work / School Withdrawal Symptoms: Other (Comment) (None voiced at this time) Name of Substance 1: Alcohol  1 - Age of First Use: 20 1 - Amount (size/oz): "1 pint up to Mccarthy 1/5"  1 - Frequency: 3-4 times per week 1 - Duration: ongoing 1 - Last Use / Amount: last night Name of Substance 2: THC  2 - Amount (size/oz): varies  2 - Last Use / Amount: 03-13-16 Name of Substance 3: Adderall  3 - Age of First Use: 23 3 - Amount (size/oz): 6-20mg   3 - Frequency: "I wasn't consistant" 3 - Duration: unknown 3 - Last Use / Amount: Feb 2017              Sleep: Fair  Appetite:  Fair  Current Medications: Current Facility-Administered Medications  Medication Dose Route Frequency Provider Last Rate Last Dose  . acetaminophen (TYLENOL) tablet 650 mg  650 mg Oral Q6H PRN Charm Rings, NP      . alum & mag  hydroxide-simeth (MAALOX/MYLANTA) 200-200-20 MG/5ML suspension 30 mL  30 mL Oral Q4H PRN Charm Rings, NP      . Melene Muller ON 03/22/2016] ARIPiprazole (ABILIFY) tablet 5 mg  5 mg Oral Daily Rachael Fee, MD      . citalopram (CELEXA) tablet 40 mg  40 mg Oral Daily Charm Rings, NP   40 mg at 03/21/16 0819  . lamoTRIgine (LAMICTAL) tablet 100 mg  100 mg Oral Daily Charm Rings, NP   100 mg at 03/21/16 0819  . LORazepam (ATIVAN) tablet 0-4 mg  0-4 mg Oral Q12H Charm Rings, NP   0 mg at 03/21/16 0820  . magnesium hydroxide (MILK OF MAGNESIA) suspension 30 mL  30 mL Oral Daily PRN Charm Rings, NP      . nicotine polacrilex (NICORETTE) gum 2 mg  2 mg Oral PRN Rachael Fee, MD   2 mg at 03/21/16 1642  . ondansetron (ZOFRAN) tablet 4 mg  4 mg Oral Q8H PRN Charm Rings, NP      . traZODone (DESYREL) tablet 50 mg  50 mg Oral QHS PRN Rachael Fee, MD   50 mg at 03/20/16 2100    Lab Results:  Results for orders placed or performed during the hospital encounter of 03/19/16 (from the past 48 hour(s))  Lipid panel     Status: Abnormal   Collection Time: 03/21/16  6:30 AM  Result Value Ref Range   Cholesterol 302 (H) 0 - 200 mg/dL   Triglycerides 161 (H) <150 mg/dL   HDL 41 >09 mg/dL   Total CHOL/HDL Ratio 7.4 RATIO   VLDL 67 (H) 0 - 40 mg/dL   LDL Cholesterol 604 (H) 0 - 99 mg/dL    Comment:        Total Cholesterol/HDL:CHD Risk Coronary Heart Disease Risk Table                     Men   Women  1/2 Average Risk   3.4   3.3  Average Risk       5.0   4.4  2 X Average Risk   9.6   7.1  3 X Average Risk  23.4   11.0        Use the calculated Patient Ratio above and the CHD Risk Table to determine the patient's CHD Risk.        ATP III CLASSIFICATION (LDL):  <100     mg/dL   Optimal  540-981  mg/dL   Near or Above  Optimal  130-159  mg/dL   Borderline  [id number]  mg/dL   High  >782>190     mg/dL   Very High [facility]      Blood Alcohol level:   Lab Results  Component Value Date   [facility] 03/[facility]2016    Physical Findings: AIMS: Facial and Oral Movements Muscles of Facial Expression: None, normal Lips and Perioral Area: None, normal Jaw: None, normal Tongue: None, normal,Extremity Movements Upper (arms, wrists, hands, fingers): None, normal Lower (legs, knees, ankles, toes): None, normal, Trunk Movements Neck, shoulders, hips: None, normal, Overall Severity Severity of abnormal movements (highest score from questions above): None, normal Incapacitation due to abnormal movements: None, normal Patient's awareness of abnormal movements (rate only patient's report): No Awareness, Dental Status Current problems with teeth and/or dentures?: No Does patient usually wear dentures?: No  CIWA:  CIWA-Ar Total: 1 COWS:     Musculoskeletal: Strength & Muscle Tone: within normal limits Gait & Station: normal Patient leans: normal  Psychiatric Specialty Exam: Review of Systems  Constitutional: Negative.   HENT: Negative.   Eyes: Negative.   Respiratory: Negative.   Cardiovascular: Negative.   Gastrointestinal: Negative.   Genitourinary: Negative.   Musculoskeletal: Negative.   Skin: Negative.   Neurological: Negative.   Endo/Heme/Allergies: Negative.   Psychiatric/Behavioral: Positive for depression and substance abuse. The patient is nervous/anxious.     Blood pressure 131/79, pulse 69, temperature 97.5 F (36.4 C), temperature source Oral, resp. rate 16, height 5\' 9"  (1.753 m), weight 75.297 kg (166 lb), SpO2 100 %.Body mass index is 24.5 kg/(m^2).  General Appearance: Fairly Groomed  Patent attorneyye Contact::  Fair  Speech:  Clear and Coherent  Volume:  normal  Mood:  Euthymic  Affect:  Appropriate  Thought Process:  Coherent and Goal Directed  Orientation:  Full (Time, Place, and Person)  Thought Content:  symptoms events worries concerns  Suicidal Thoughts:  No  Homicidal Thoughts:  No  Memory:  Immediate;   Fair Recent;    Fair Remote;   Fair  Judgement:  Fair  Insight:  Present  Psychomotor Activity:  Restlessness  Concentration:  Fair  Recall:  [employer]Fair  Fund of Knowledge:Fair  Language: Fair  Akathisia:  No  Handed:  Right  AIMS (if indicated):     Assets:  Desire for Improvement Housing Social Support Vocational/Educational  ADL's:  Intact  Cognition: WNL  Sleep:  Number of Hours: 5.5   Treatment Plan Summary: Daily contact with patient to assess and evaluate symptoms and progress in treatment and Medication management Supportive approach/coping skills Alcohol abuse; continue the detox/work Mccarthy relapse prevention plan Depression; continue the Celexa 40 mg Mood instability; increase the Abilify to 5 mg in AM Work with CBT/mindfulness/challenge the negative self perceptions Jerry Rylee A, MD 03/21/2016, 8:48 PM

## 2016-03-21 NOTE — Plan of Care (Signed)
Problem: Ineffective individual coping Goal: STG: Patient will remain free from self harm Outcome: Progressing Patient remains free from self harm. 15 minute checks continued per protocol for patient safety.   Problem: Alteration in mood Goal: LTG-Patient reports reduction in suicidal thoughts (Patient reports reduction in suicidal thoughts and is able to verbalize a safety plan for whenever patient is feeling suicidal)  Outcome: Progressing Patient denies having suicidal thoughts today.  Problem: Alteration in mood & ability to function due to Goal: STG-Patient will report withdrawal symptoms Outcome: Progressing Patient denies having withdrawal symptoms today.

## 2016-03-21 NOTE — [facility] Group Notes (Signed)
[facility] Group Notes:  (Clinical Social Work)  03/21/2016  10:00-11:00AM  Summary of Progress/Problems:   The main focus of today's process group was to   1)  discuss the importance of adding supports  2)  define health supports versus unhealthy supports  3)  identify the patient's current unhealthy supports and plan how to handle them  4)  Identify the patient's current healthy supports and plan what to add.  An emphasis was placed on using counselor, doctor, therapy groups, 12-step groups, and problem-specific support groups to expand supports.    The patient expressed full comprehension of the concepts presented, and agreed that there is a need to add more supports.  The patient stated he intends to starting going to Alcoholics Anonymous, which he has never done before.  He also had talked about the importance of getting a sponsor, so may be invested in doing that.  Type of Therapy:  Process Group with Motivational Interviewing  Participation Level:  Active  Participation Quality:  Attentive and Sharing  Affect:  Appropriate  Cognitive:  Appropriate  Insight:  Engaged  Engagement in Therapy:  Engaged  Modes of Intervention:   Education, Support and Processing, Activity  Ambrose MantleMareida Grossman-Orr, LCSW 03/21/2016

## 2016-03-21 NOTE — B[facility] Group Notes (Signed)
[facility] Group Notes:  (Nursing/MHT/Case Management/Adjunct)  Date:  03/21/2016  Time:  1415pm  Type of Therapy:  Completed by nursing students  Participation Level:  Active  Participation Quality:  Appropriate and Attentive  Affect:  Appropriate  Cognitive:  Alert and Appropriate  Insight:  Appropriate  Engagement in Group:  Engaged  Modes of Intervention:  Activity  Summary of Progress/Problems: Patient attended group and participated.  Jonathon BellowsBrittany G Malania Gawthrop 03/21/2016, 3:28 PM

## 2016-03-22 LAB — HEMOGLOBIN A1C
Hgb A1c MFr Bld: 5.5 % (ref 4.8–5.6)
Mean Plasma Glucose: 111 mg/dL

## 2016-03-22 MED ORDER — NICOTINE POLACRILEX 2 MG MT GUM: 2.0000 mg | CHEWING_GUM | OROMUCOSAL | Status: DC | PRN

## 2016-03-22 MED ORDER — LAMOTRIGINE 100 MG PO TABS
100.0000 mg | ORAL_TABLET | Freq: Every day | ORAL | Status: DC
Start: 2016-03-22 — End: 2016-04-07

## 2016-03-22 MED ORDER — CITALOPRAM HYDROBROMIDE 40 MG PO TABS: 40.0000 mg | ORAL_TABLET | Freq: Every day | ORAL | Status: DC

## 2016-03-22 MED ORDER — ARIPIPRAZOLE 5 MG PO TABS
5.0000 mg | ORAL_TABLET | Freq: Every day | ORAL | Status: DC
Start: 2016-03-22 — End: 2016-04-07

## 2016-03-22 MED ORDER — TRAZODONE HCL 50 MG PO TABS: ORAL_TABLET | ORAL | Status: DC

## 2016-03-22 NOTE — Progress Notes (Signed)
The patient attended last evening's A.A. Meeting and was appropriate.  

## 2016-03-22 NOTE — Tx Team (Signed)
Interdisciplinary Treatment Plan Update (Adult) Date: 03/22/2016   Time Reviewed: 9:30 AM  Progress in Treatment: Attending groups: Yes Participating in groups: Yes Taking medication as prescribed: Yes Tolerating medication: Yes Family/Significant other contact made: No, CSW assessing for appropriate contacts Patient understands diagnosis: Yes Discussing patient identified problems/goals with staff: Yes Medical problems stabilized or resolved: Yes Denies suicidal/homicidal ideation: Yes Issues/concerns per patient self-inventory: Yes Other:  New problem(s) identified: N/A  Discharge Plan or Barriers: Home with outpatient services    Reason for Continuation of Hospitalization:  Depression Anxiety Medication Stabilization   Comments: N/A  Estimated length of stay: Discharge anticipated for today 03/22/16   Patient is a 25yo male admitted to the hospital with intermittent (but not current) SI, depression and alcohol issues. He reports primary trigger for admission was multiple stressors including financial and relationship as well as alcohol, Adderall, and marijuana use. Patient will benefit from crisis stabilization, medication evaluation, group therapy and psychoeducation, in addition to case management for discharge planning. At discharge it is recommended that Patient adhere to the established discharge plan and continue in treatment.  Review of initial/current patient goals per problem list:  1. Goal(s): Patient will participate in aftercare plan   Met: Yes   Target date: 3-5 days post admission date   As evidenced by: Patient will participate within aftercare plan AEB aftercare provider and housing plan at discharge being identified.  4/10: Goal met. Patient will return home to follow up with outpatient services.    2. Goal (s): Patient will exhibit decreased depressive symptoms and suicidal ideations.   Met: Yes   Target date: 3-5 days post admission  date   As evidenced by: Patient will utilize self rating of depression at 3 or below and demonstrate decreased signs of depression or be deemed stable for discharge by MD.  4/10: Goal met. Patient rates depression at 2, denies SI.    3. Goal(s): Patient will demonstrate decreased signs and symptoms of anxiety.   Met: Adequate for discharge per MD   Target date: 3-5 days post admission date   As evidenced by: Patient will utilize self rating of anxiety at 3 or below and demonstrated decreased signs of anxiety, or be deemed stable for discharge by MD   4/10: Adequate for discharge. Patient rates anxiety at 5, but reports feeling safe to discharge.   4. Goal(s): Patient will demonstrate decreased signs of withdrawal due to substance abuse   Met: Yes   Target date: 3-5 days post admission date   As evidenced by: Patient will produce a CIWA/COWS score of 0, have stable vitals signs, and no symptoms of withdrawal  4/10: Goal met. No withdrawal symptoms reported at this time per medical chart.     Attendees: Patient:    Family:    Physician:  Dr. Sabra Heck 03/22/2016 9:30 AM  Nursing: Gaylan Gerold, Mayra Neer, RN 03/22/2016 9:30 AM  Clinical Social Worker: Erasmo Downer Torrance Stockley, LCSW 03/22/2016 9:30 AM  Other:  03/22/2016 9:30 AM   03/22/2016 9:30 AM   03/22/2016 9:30 AM  Other:  Agustina Caroli, NP 03/22/2016 9:30 AM  Other:    Other:     Scribe for Treatment Team:  Tilden Fossa, Jerauld

## 2016-03-22 NOTE — Progress Notes (Signed)
  [facility] :  Will you be returning to the same living situation after discharge:  No.Patient plans to stay with his grandmother at discharge At discharge, do you have transportation home?: Yes,  grandmother to transport Do you have the ability to pay for your medications: Yes,  patient will be provided with prescriptions at discharge  Release of information consent forms completed and in the chart;  Patient's signature needed at discharge.  Patient to Follow up at: Follow-up Information    Follow up with Dr. Garnetta BuddyFaheem, [facility]. Go on 03/25/2016.   Why:  Medication management appointment on Thursday April 13th at 12pm. Please arrive 30 minutes early and bring your insurance card. Call office if you need to reschedule appointment.   Contact information:   [address] [place]  [id number] Telephone:  [phone]      Go to Ramond DialNathan Blake, [facility].   Why:  Social Worker will call you at discharge with therapy appointment.   Contact information:   [address] [place], [place] [address] Telephone: [phone]      Next level of care provider has access to [facility] Link:no  Safety Planning and Suicide Prevention discussed: Yes,  with patient and mother  Have you used any form of tobacco in the last 30 days? (Cigarettes, Smokeless Tobacco, Cigars, and/or Pipes): Yes  Has patient been referred to the Quitline?: Patient refused referral  Patient has been referred for addiction treatment: Yes  Cashae Weich, West CarboKristin L 03/22/2016, 10:40 AM

## 2016-03-22 NOTE — Plan of Care (Signed)
Problem: Alteration in mood & ability to function due to Goal: STG-Patient will attend groups Outcome: Progressing Pt has attended groups on the unit this weekend.

## 2016-03-22 NOTE — [facility] Suicide Risk Assessment (Signed)
[facility]   Principal Problem: Bipolar affective disorder, depressed, severe (HCC) Discharge Diagnoses:  Patient Active Problem List   Diagnosis Date Noted  . Alcohol abuse [F10.10] 03/19/2016  . Bipolar affective disorder, depressed, severe (HCC) [F31.4] 03/19/2016  . Affective psychosis, bipolar (HCC) [F31.9]   . Polysubstance abuse [F19.10]   . ADD (attention deficit disorder) [F90.9] 06/09/2015  . Anxiety [F41.9] 06/09/2015  . Bipolar affect, depressed (HCC) [F31.30] 06/09/2015  . Clinical depression [F32.9] 06/09/2015  . Headache, migraine [G43.909] 06/09/2015  . Cannot sleep [G47.00] 06/09/2015  . Drug-related disorder (HCC) [F19.99] 06/09/2015  . Seizure (HCC) [R56.9] 06/09/2015    Total Time spent with patient: 20 minutes  Musculoskeletal: Strength & Muscle Tone: within normal limits Gait & Station: normal Patient leans: normal  Psychiatric Specialty Exam: Review of Systems  Constitutional: Negative.   HENT: Negative.   Eyes: Negative.   Respiratory: Negative.   Cardiovascular: Negative.   Gastrointestinal: Negative.   Genitourinary: Negative.   Musculoskeletal: Positive for joint pain.  Skin: Negative.   Neurological: Negative.   Endo/Heme/Allergies: Negative.   Psychiatric/Behavioral: The patient is nervous/anxious.     Blood pressure 121/70, pulse 72, temperature 97.5 F (36.4 C), temperature source Oral, resp. rate 16, height 5\' 9"  (1.753 m), weight 75.297 kg (166 lb), SpO2 100 %.Body mass index is 24.5 kg/(m^2).  General Appearance: Fairly Groomed  Patent attorneyye Contact::  Fair  Speech:  Clear and Coherent409  Volume:  Normal  Mood:  Euthymic  Affect:  Appropriate  Thought Process:  Coherent and Goal Directed  Orientation:  Full (Time, Place, and Person)  Thought Content:  plans as he moves on relapse prevention plan  Suicidal Thoughts:  No  Homicidal Thoughts:  No  Memory:  Immediate;   Fair Recent;   Fair Remote;   Fair   Judgement:  Fair  Insight:  Present  Psychomotor Activity:  Normal  Concentration:  Fair  Recall:  FiservFair  Fund of Knowledge:Fair  Language: Fair  Akathisia:  No  Handed:  Ambidextrous  AIMS (if indicated):     Assets:  Desire for Improvement Housing Social Support Talents/Skills Vocational/Educational  Sleep:  Number of Hours: 6.5  Cognition: WNL  ADL's:  Intact  In full contact with reality. There are no active SI plans or intent. He is willing and motivated to pursue long term abstinence Mental Status Per Nursing Assessment::   On Admission:  Self-harm thoughts  Demographic Factors:  Male, Adolescent or young adult and Caucasian  Loss Factors: Loss of significant relationship  Historical Factors: none identified  Risk Reduction Factors:   Sense of responsibility to family, Religious beliefs about death, Living with another person, especially a relative and Positive social support  Continued Clinical Symptoms:  Bipolar Disorder:   Depressive phase Alcohol/Substance Abuse/Dependencies  Cognitive Features That Contribute To Risk:  None    Suicide Risk:  Minimal: No identifiable suicidal ideation.  Patients presenting with no risk factors but with morbid ruminations; may be classified as minimal risk based on the severity of the depressive symptoms  Follow-up Information    Follow up with Dr. Daleen Boavi, [facility]. Go on 04/05/2016.   Why:  Medication management appointment on Monday April 24th at 11am. Please arrive 30 minutes early and bring your insurance card. Call office if you need to reschedule appointment.   Contact information:   [address] [place]  [id number] Telephone:  [phone]      Go to  Ramond Dial, [facility].   Why:  Social Worker will call you at discharge with therapy appointment.   Contact information:   [address] [place], [place] [address] Telephone: [phone]       Plan Of Care/Follow-up recommendations:  Activity:  as tolerated Diet:  regular Follow up as above Ewan Grau A, MD 03/22/2016, 12:57 PM

## 2016-03-22 NOTE — BHH Suicide Risk Assessment (Signed)
BHH INPATIENT:  Family/Significant Other Suicide Prevention Education  Suicide Prevention Education:  Education Completed; mother Jerry PointsRobin Mccarthy [phone],  (name of family member/significant other) has been identified by the patient as the family member/significant other with whom the patient will be residing, and identified as the person(s) who will aid the patient in the event of a mental health crisis (suicidal ideations/suicide attempt).  With written consent from the patient, the family member/significant other has been provided the following suicide prevention education, prior to the and/or following the discharge of the patient.  The suicide prevention education provided includes the following:  Suicide risk factors  Suicide prevention and interventions  National Suicide Hotline telephone number  [facility] assessment telephone number  [facility] Emergency Assistance 911  [facility] and/or [facility]idential Mobile Crisis Unit telephone number  Request made of family/significant other to:  Remove weapons (e.g., guns, rifles, knives), all items previously/currently identified as safety concern.    Remove drugs/medications (over-the-counter, prescriptions, illicit drugs), all items previously/currently identified as a safety concern.  The family member/significant other verbalizes understanding of the suicide prevention education information provided.  The family member/significant other agrees to remove the items of safety concern listed above.  Jerry Mccarthy, Jerry Mccarthy 03/22/2016, 10:39 AM

## 2016-03-22 NOTE — [facility] Group Notes (Signed)
   [facility] Note  03/22/2016  8:45 AM   Participation Quality: Alert, Appropriate and Oriented  Mood/Affect: Appropriate  Depression Rating: 2  Anxiety Rating: 5  Thoughts of Suicide: Pt denies SI/HI  Will you contract for safety? Yes  Current AVH: Pt denies  Plan for Discharge/Comments: Pt attended discharge planning group and actively participated in group. CSW provided pt with today's workbook. Patient plans to return home to follow up with outpatient services.  Transportation Means: Pt reports access to transportation  Supports: No supports mentioned at this time  Samuella BruinKristin Leroi Haque, MSW, Johnson [employer] JohnsonLCSW Clinical Social Worker [facility] [phone]

## 2016-03-22 NOTE — Discharge Summary (Signed)
Physician Discharge Summary Note  Patient:  Jerry Mccarthy is an 25 y.o., male MRN:  [id number] DOB:  1991-01-04 Patient phone:  [phone] (home)  Patient address:   [address] [place] [address],  Total Time spent with patient: Greter than 30 minutes  Date of Admission:  03/19/2016 Date of Discharge: 03-22-16  Reason for Admission:  Alcohol detox/mood stabilization treatments.  Principal Problem: Bipolar affective disorder, depressed, severe [facility](HCC)  Discharge Diagnoses: Patient Active Problem List   Diagnosis Date Noted  . Alcohol abuse [F10.10] 03/19/2016  . Bipolar affective disorder, depressed, severe (HCC) [F31.4] 03/19/2016  . Affective psychosis, bipolar (HCC) [F31.9]   . Polysubstance abuse [F19.10]   . ADD (attention deficit disorder) [F90.9] 06/09/2015  . Anxiety [F41.9] 06/09/2015  . Bipolar affect, depressed (HCC) [F31.30] 06/09/2015  . Clinical depression [F32.9] 06/09/2015  . Headache, migraine [G43.909] 06/09/2015  . Cannot sleep [G47.00] 06/09/2015  . Drug-related disorder (HCC) [F19.99] 06/09/2015  . Seizure (HCC) [R56.9] 06/09/2015   Past Psychiatric History: Alcohol abuse, Major depression  Past Medical History:  Past Medical History  Diagnosis Date  . Bipolar 2 disorder (HCC)   . Anxiety and depression    History reviewed. No pertinent past surgical history.  Family History:  Family History  Problem Relation Age of Onset  . Depression Mother   . Cancer Maternal Grandmother   . Bipolar disorder Maternal Grandmother   . Lung cancer Paternal Grandmother   . Hyperlipidemia Father    Family Psychiatric  History: See H&P  Social History:  History  Alcohol Use  . 0.0 - 2.4 oz/week  . 0 Glasses of wine, 0-4 Cans of beer, 0 Shots of liquor per week    Comment: a fifth 3-4 times a week     History  Drug Use  . Yes  . Special: Marijuana    Comment: last used on 12-18-15    Social History   Social History  . Marital Status: Single   Spouse Name: N/A  . Number of Children: 0  . Years of Education: College   Occupational History  .  Other    [employer]   Social History Main Topics  . Smoking status: Current Every Day Smoker -- 0.25 packs/day    Types: Cigarettes    Start date: 12/25/2014  . Smokeless tobacco: Former NeurosurgeonUser    Types: Chew  . Alcohol Use: 0.0 - 2.4 oz/week    0 Glasses of wine, 0-4 Cans of beer, 0 Shots of liquor per week     Comment: a fifth 3-4 times a week  . Drug Use: Yes    Special: Marijuana     Comment: last used on 12-18-15  . Sexual Activity: Yes    Birth Control/ Protection: Condom   Other Topics Concern  . None   Social History Narrative   Patient lives on campus at [employer].   Caffeine Use: 2 cups daily   Hospital Course:  25 Y/O male who states he has been drinking a lot of alcohol. Has been drinking a pint to a fith of liquor 3-4 times a week for a year and a half. Used to drink socially one night every other week. States he has been dealing with a lot of stress. As the stress increased, he drank more. 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 Few weeks ago decided he had  to quit to take care of himself.  Jerry Mccarthy was admitted to the [facility] hospital for alcohol detoxification treatment. His blood alcohol level upon admission was 123 per toxicology tests reports & UDS positive for THC. He was intoxicated. Jerry Mccarthy cited stress as the reason for his increased alcohol consumption. He stated on admission that he needed to quit drinking alcohol to take care of himself. After admission assessment it was determined that Jerry Mccarthy will need alcohol detoxification treatment as well as mental health care. Chart review indicated Jerry Mccarthy was receiving mental health care for Bipolar disorder on an outpatient in [facility] prior to his [facility] admission. Jerry Mccarthy received Ativan detox protocols  for his alcohol detox treatment.  Besides the detoxification treatment, Jerry Mccarthy was also medicated and discharged on; Abilify 5 mg for mood control, Citalopram 40 mg for depression, Lamictal 100 mg for mood stabilization & Trazodone 50 mg for insomnia. He presented no other significant medical issues that required treatment or monitoring. Jerry Mccarthy tolerated his treatment regimen without any significant adverse effects and or reactions presented. He was enrolled & participated in the AA/NA meetings and group counseling sessions being offered and held on this unit. He learned coping skills.  Jerry Mccarthy has completed detox treatment & his mood is stable. He is currently being discharged to his home to continue treatment on an outpatient basis as noted below. He has been given all the necessary information needed to make this appointments without problems. Upon discharge, he adamantly denies any SIHI, AVH, delusional thoughts, paranoia and or withdrawal symptoms. He received some samples of his discharge medicines. He left [facility] with all personal belongings in no distress. Transportation per grandfather.   Consults:  psychiatry  Physical Findings: AIMS: Facial and Oral Movements Muscles of Facial Expression: None, normal Lips and Perioral Area: None, normal Jaw: None, normal Tongue: None, normal,Extremity Movements Upper (arms, wrists, hands, fingers): None, normal Lower (legs, knees, ankles, toes): None, normal, Trunk Movements Neck, shoulders, hips: None, normal, Overall Severity Severity of abnormal movements (highest score from questions above): None, normal Incapacitation due to abnormal movements: None, normal Patient's awareness of abnormal movements (rate only patient's report): No Awareness, Dental Status Current problems with teeth and/or dentures?: No Does patient usually wear dentures?: No  CIWA:  CIWA-Ar Total: 0 COWS:     Musculoskeletal: Strength & Muscle Tone: within normal limits Gait &  Station: normal Patient leans: N/A  Psychiatric Specialty Exam: Review of Systems  Constitutional: Negative.   HENT: Negative.   Eyes: Negative.   Respiratory: Negative.   Cardiovascular: Negative.   Gastrointestinal: Negative.   Genitourinary: Negative.   Musculoskeletal: Negative.   Skin: Negative.   Neurological: Negative.   Endo/Heme/Allergies: Negative.   Psychiatric/Behavioral: Positive for depression (Stable) and substance abuse (Alcoholism, chronic). Negative for suicidal ideas, hallucinations and memory loss. The patient has insomnia (stable). The patient is not nervous/anxious.     Blood pressure 121/70, pulse 72, temperature 97.5 F (36.4 C), temperature source Oral, resp. rate 16, height  (1.753 m), weight 75.297 kg (166 lb), SpO2 100 %.Body mass index is 24.5 kg/(m^2).  See Md's SRA   Have you used any form of tobacco in the last 30 days? (Cigarettes, Smokeless Tobacco, Cigars, and/or Pipes): Yes  Has this patient used any form of tobacco in the last 30 days? (Cigarettes, Smokeless Tobacco, Cigars, and/or Pipes) Yes, Yes, A prescription for an FDA-approved tobacco cessation medication was offered at discharge and the patient refused  Blood Alcohol level:  Lab Results  Component  Value Date   [facility] 123* 03/19/2016   Metabolic Disorder Labs:  Lab Results  Component Value Date   HGBA1C 5.5 03/21/2016   MPG 111 03/21/2016   No results found for: PROLACTIN Lab Results  Component Value Date   CHOL 302* 03/21/2016   TRIG 337* 03/21/2016   HDL 41 03/21/2016   CHOLHDL 7.4 03/21/2016   VLDL 67* 03/21/2016   LDLCALC 194* 03/21/2016   See Psychiatric Specialty Exam and Suicide Risk Assessment completed by Attending Physician prior to discharge.  Discharge destination:  Home  Is patient on multiple antipsychotic therapies at discharge:  No   Has Patient had three or more failed trials of antipsychotic monotherapy by history:  No  Recommended Plan for Multiple  Antipsychotic Therapies: NA    Medication List    STOP taking these medications        ibuprofen 200 MG tablet  Commonly known as:  ADVIL,MOTRIN     QUEtiapine 50 MG tablet  Commonly known as:  SEROQUEL      TAKE these medications      Indication   ARIPiprazole 5 MG tablet  Commonly known as:  ABILIFY  Take 1 tablet (5 mg total) by mouth daily. Mood control   Indication:  Mood control     citalopram 40 MG tablet  Commonly known as:  CELEXA  Take 1 tablet (40 mg total) by mouth daily. For depression   Indication:  Depression     lamoTRIgine 100 MG tablet  Commonly known as:  LAMICTAL  Take 1 tablet (100 mg total) by mouth daily. For mood stabilization   Indication:  Mood stabilization     nicotine polacrilex 2 MG gum  Commonly known as:  NICORETTE  Take 1 each (2 mg total) by mouth as needed for smoking cessation.   Indication:  Nicotine Addiction     traZODone 50 MG tablet  Commonly known as:  DESYREL  Take 1 tablet (50 mg) at bedtime: For sleep   Indication:  Trouble Sleeping       Follow-up Information    Follow up with Dr. Garnetta Buddy, [facility]. Go on 03/25/2016.   Why:  Medication management appointment on Thursday April 13th at 12pm. Please arrive 30 minutes early and bring your insurance card. Call office if you need to reschedule appointment.   Contact information:   [address] [place]  [address] Telephone:  [phone]      Go to Ramond Dial, [facility].   Why:  Social Worker will call you at discharge with therapy appointment.   Contact information:   [address] [place], [place] [address] Telephone: [phone]     Follow-up recommendations: Activity:  As tolerated Diet: As recommended by your primary care doctor. Keep all scheduled follow-up appointments as recommended.  Comments: Take all your medications as prescribed by your mental healthcare provider. Report  any adverse effects and or reactions from your medicines to your outpatient provider promptly. Patient is instructed and cautioned to not engage in alcohol and or illegal drug use while on prescription medicines. In the event of worsening symptoms, patient is instructed to call the crisis hotline, 911 and or go to the nearest ED for appropriate evaluation and treatment of symptoms. Follow-up with your primary care provider for your other medical issues, concerns and or health care needs.   Signed: Sanjuana Kava, NP, PMHNP-BC 03/22/2016, 11:05 AM  I personally assessed the patient and  formulated the plan Madie RenoIrving A. Dub MikesLugo, M.D.

## 2016-03-22 NOTE — Progress Notes (Signed)
Discharge note:  Patient discharged home per MD order.  Patient received all personal belongings from unit and locker. Reviewed discharge instructions and medications with patient.  Reviewed prescriptions and follow up appointments.  Patient indicated understanding of same.  He denies SI/HI/AVH.  Patient left ambulatory with his grandmother.  [place].

## 2016-03-22 NOTE — Progress Notes (Signed)
Recreation Therapy Notes  Date: 04.10.2017 Time: 9:30am Location: 300 Hall Group Room   Group Topic: Stress Management  Goal Area(s) Addresses:  Patient will actively participate in stress management techniques presented during session.   Behavioral Response: Appropriate, Engaged  Intervention: Stress management techniques  Activity :  Diaphragmatic Breathing, Mindful Breathing and Mindfulness. LRT provided education, instruction and demonstration on practice of Diaphragmatic Breathing, Mindful Breathing and Mindfulness. Patient was asked to participate in technique introduced during session.   Education:  Stress Management, Discharge Planning.   Education Outcome: Acknowledges education  Clinical Observations/Feedback: Patient actively engaged in technique introduced, expressed no concerns and demonstrated ability to practice independently post d/c.   Marykay Lexenise L Beckett Hickmon, LRT/CTRS        Criss Pallone L 03/22/2016 1:36 PM

## 2016-03-25 ENCOUNTER — Ambulatory Visit: Payer: 59 | Admitting: Psychiatry

## 2016-04-05 ENCOUNTER — Ambulatory Visit: Payer: 59 | Admitting: Psychiatry

## 2016-04-07 ENCOUNTER — Encounter: Payer: Self-pay | Admitting: Psychiatry

## 2016-04-07 ENCOUNTER — Ambulatory Visit (INDEPENDENT_AMBULATORY_CARE_PROVIDER_SITE_OTHER): Payer: 59 | Admitting: Psychiatry

## 2016-04-07 VITALS — BP 102/80 | HR 85 | Temp 97.8°F | Ht 69.0 in | Wt 171.2 lb

## 2016-04-07 DIAGNOSIS — F3130 Bipolar disorder, current episode depressed, mild or moderate severity, unspecified: Secondary | ICD-10-CM | POA: Diagnosis not present

## 2016-04-07 MED ORDER — ARIPIPRAZOLE 5 MG PO TABS: 5.0000 mg | ORAL_TABLET | Freq: Every day | ORAL | Status: DC

## 2016-04-07 MED ORDER — CITALOPRAM HYDROBROMIDE 40 MG PO TABS: 40.0000 mg | ORAL_TABLET | Freq: Every day | ORAL | Status: DC

## 2016-04-07 MED ORDER — TRAZODONE HCL 50 MG PO TABS: ORAL_TABLET | ORAL | Status: DC

## 2016-04-07 NOTE — Progress Notes (Signed)
Patient ID: Jerry Mccarthy, male   DOB: 01/14/1991, 25 y.o.   MRN: [id number] [facility] OP Progress Note  04/07/2016 1:09 PM Jerry Mccarthy  MRN:  [id number]  Subjective:  Patient returns follow-up as bipolar disorder type 1. Patient was previously seen by Dr. Mayford KnifeWilliams and is the first visit for this patient with this clinician. Patient was most [facility] for alcohol detoxification. His medications were adjusted. Today he presents for follow-up appointment. He reports that he is doing quite well currently. States that the medication changes have really helped him. States that he has to some are jobs that he is looking forward to and will be starting next week. She is wondering if the Abilify needs to be adjusted since he was feeling quite good as soon as he was discharged and now it seems to be at a plateau. Discussed with him that down since he is not depressed and doing quite well he may not need any further medication adjustments right away. He denies any suicidal thoughts. States he is quite motivated to go back to school in the fall and complete his degree with a major in microbiology. Reports he is attending up to 3 AA meetings per week to help with his alcohol abuse. States he has not drank much since he was discharged. Reports he had to 12 on some wine coolers last night and was able to stop there.  Chief Complaint: Doing well  Chief Complaint    Follow-up; Medication Refill     Visit Diagnosis:     ICD-9-CM ICD-10-CM   1. Bipolar I disorder, most recent episode depressed (HCC) 296.50 F31.30     Past Medical History:  Past Medical History  Diagnosis Date  . Bipolar 2 disorder (HCC)   . Anxiety and depression   . ADHD (attention deficit hyperactivity disorder)    History reviewed. No pertinent past surgical history. Family History:  Family History  Problem Relation Age of Onset  . Depression Mother   . Cancer Maternal  Grandmother   . Bipolar disorder Maternal Grandmother   . Lung cancer Paternal Grandmother   . Hyperlipidemia Father    Social History:  Social History   Social History  . Marital Status: Single    Spouse Name: N/A  . Number of Children: 0  . Years of Education: College   Occupational History  .  Other    [employer]   Social History Main Topics  . Smoking status: Current Every Day Smoker -- 0.25 packs/day    Types: Cigarettes    Start date: 12/25/2014  . Smokeless tobacco: Former NeurosurgeonUser    Types: Chew  . Alcohol Use: No     Comment: a fifth 3-4 times a week  . Drug Use: Yes    Special: Marijuana     Comment: last used on 12-18-15  . Sexual Activity: Yes    Birth Control/ Protection: Condom   Other Topics Concern  . None   Social History Narrative   Patient lives on campus [facility] [employer].   Caffeine Use: 2 cups daily   Additional History:   Assessment:   Musculoskeletal: Strength & Muscle Tone: within normal limits Gait & Station: normal Patient leans: N/A  Psychiatric Specialty Exam: Anxiety    Depression        Past medical history includes anxiety.     Review of Systems  Constitutional: Negative.   HENT: Negative.   Eyes: Negative.  Respiratory: Negative.   Cardiovascular: Negative.   Gastrointestinal: Negative.   Genitourinary: Negative.   Musculoskeletal: Negative.   Skin: Negative.   Neurological: Negative.   Endo/Heme/Allergies: Negative.   Psychiatric/Behavioral: Negative.   All other systems reviewed and are negative.   Blood pressure 102/80, pulse 85, temperature 97.8 F (36.6 C), temperature source Tympanic, height  (1.753 m), weight 171 lb 3.2 oz (77.656 kg), SpO2 98 %.Body mass index is 25.27 kg/(m^2).  General Appearance: Well Groomed  Eye Contact:  Good  Speech:  Normal Rate  Volume:  Normal  Mood:  Good   Affect:  Congruent  Thought Process:  Linear and Logical  Orientation:  Full (Time, Place, and Person)   Thought Content:  Negative  Suicidal Thoughts:  No  Homicidal Thoughts:  No  Memory:  Immediate;   Good Recent;   Good Remote;   Good  Judgement:  Good  Insight:  Good  Psychomotor Activity:  Negative  Concentration:  Good  Recall:  Good  Fund of Knowledge: Good  Language: Good  Akathisia:  Negative  Handed:  Right unknown   AIMS (if indicated):  Done 09/01/15 normal  Assets:  Communication Skills Desire for Improvement Social Support  ADL's:  Intact  Cognition: WNL  Sleep:  Good    Is the patient at risk to self?  No. Has the patient been a risk to self in the past 6 months?  No. Has the patient been a risk to self within the distant past?  No. Is the patient a risk to others?  No. Has the patient been a risk to others in the past 6 months?  No. Has the patient been a risk to others within the distant past?  No.  Current Medications: Current Outpatient Prescriptions  Medication Sig Dispense Refill  . ARIPiprazole (ABILIFY) 5 MG tablet Take 1 tablet (5 mg total) by mouth daily. Mood control 30 tablet 0  . citalopram (CELEXA) 40 MG tablet Take 1 tablet (40 mg total) by mouth daily. For depression 30 tablet 0  . traZODone (DESYREL) 50 MG tablet Take 1 tablet (50 mg) at bedtime: For sleep 30 tablet 0  . lamoTRIgine (LAMICTAL) 150 MG tablet      No current facility-administered medications for this visit.    Medical Decision Making:  Established Problem, Stable/Improving (1), Review of Medication Regimen & Side Effects (2) and Review of New Medication or Change in Dosage (2)  Treatment Plan Summary:Medication management and Plan   Bipolar disorder type II, most recent episode depressed We will continue the patient's citalopram at 40 mg daily, continue Lamictal at 150 mg daily.  Continue Abilify at 5 mg daily Continue trazodone at 50 mg at bedtime as needed daily Recommend that patient do cardio workout at least 3-4 times a week. Discussed with him the research findings  that intense exercise helps curb alcohol cravings and also that it would help with his racing thoughts and help him settle down. Patient is agreeable to this plan. Return to clinic in 1 month's time and we will assess the need for further medication adjustment. Patient is to call before if needed.  Delesia Martinek 04/07/2016, 1:09 PM

## 2016-05-06 ENCOUNTER — Encounter: Payer: Self-pay | Admitting: Psychiatry

## 2016-05-06 ENCOUNTER — Ambulatory Visit (INDEPENDENT_AMBULATORY_CARE_PROVIDER_SITE_OTHER): Payer: 59 | Admitting: Psychiatry

## 2016-05-06 VITALS — BP 136/80 | HR 84 | Temp 97.4°F | Ht 69.0 in | Wt 173.4 lb

## 2016-05-06 DIAGNOSIS — F3130 Bipolar disorder, current episode depressed, mild or moderate severity, unspecified: Secondary | ICD-10-CM | POA: Diagnosis not present

## 2016-05-06 MED ORDER — LAMOTRIGINE 150 MG PO TABS: 150.0000 mg | ORAL_TABLET | Freq: Every day | ORAL | Status: DC

## 2016-05-06 NOTE — Progress Notes (Signed)
Patient ID: Jerry Mccarthy, male   DOB: [birth year], 25 y.o.   MRN: [id number] [facility] MD/PA/NP OP Progress Note  05/06/2016 3:03 PM TREQUAN MARSOLEK  MRN:  [id number]  Subjective:  Patient returns follow-up as bipolar disorder I for follow-up appointment. He reports that he is doing quite well currently. Started both his jobs and doing well. He has been promoted at [employer].  He had 5 drinks in the past week. States that he has been to some AA meetings and is seeing his therapist regularly on a weekly basis. Feeling much better. States he is doing well and feels better about himself and the long time. States he is quite motivated to go back to school in the fall and complete his degree with a major in microbiology. Denies any side effects from his medications.   Chief Complaint: Doing well   Visit Diagnosis:     ICD-9-CM ICD-10-CM   1. Bipolar I disorder, most recent episode depressed (HCC) 296.50 F31.30     Past Medical History:  Past Medical History  Diagnosis Date  . Bipolar 2 disorder (HCC)   . Anxiety and depression   . ADHD (attention deficit hyperactivity disorder)    No past surgical history on file. Family History:  Family History  Problem Relation Age of Onset  . Depression Mother   . Cancer Maternal Grandmother   . Bipolar disorder Maternal Grandmother   . Lung cancer Paternal Grandmother   . Hyperlipidemia Father    Social History:  Social History   Social History  . Marital Status: Single    Spouse Name: N/A  . Number of Children: 0  . Years of Education: College   Occupational History  .  Other    [employer]   Social History Main Topics  . Smoking status: Current Every Day Smoker -- 0.25 packs/day    Types: Cigarettes    Start date: 12/25/2014  . Smokeless tobacco: Former Neurosurgeon    Types: Chew  . Alcohol Use: No     Comment: a fifth 3-4 times a week  . Drug Use: Yes    Special: Marijuana     Comment: last used on 12-18-15  . Sexual Activity: Yes   Birth Control/ Protection: Condom   Other Topics Concern  . Not on file   Social History Narrative   Patient lives on campus at [employer]   Caffeine Use: 2 cups daily   Additional History:   Assessment:   Musculoskeletal: Strength & Muscle Tone: within normal limits Gait & Station: normal Patient leans: N/A  Psychiatric Specialty Exam: Anxiety    Depression        Past medical history includes anxiety.     Review of Systems  Constitutional: Negative.   HENT: Negative.   Eyes: Negative.   Respiratory: Negative.   Cardiovascular: Negative.   Gastrointestinal: Negative.   Genitourinary: Negative.   Musculoskeletal: Negative.   Skin: Negative.   Neurological: Negative.   Endo/Heme/Allergies: Negative.   All other systems reviewed and are negative.   There were no vitals taken for this visit.There is no weight on file to calculate BMI.  General Appearance: Well Groomed  Eye Contact:  Good  Speech:  Normal Rate  Volume:  Normal  Mood:  Good   Affect:  Congruent  Thought Process:  Linear and Logical  Orientation:  Full (Time, Place, and Person)  Thought Content:  Negative  Suicidal Thoughts:  No  Homicidal Thoughts:  No  Memory:  Immediate;   Good Recent;   Good Remote;   Good  Judgement:  Good  Insight:  Good  Psychomotor Activity:  Negative  Concentration:  Good  Recall:  Good  Fund of Knowledge: Good  Language: Good  Akathisia:  Negative  Handed:  Right unknown   AIMS (if indicated):  Done 09/01/15 normal  Assets:  Communication Skills Desire for Improvement Social Support  ADL's:  Intact  Cognition: WNL  Sleep:  Good    Is the patient at risk to self?  No. Has the patient been a risk to self in the past 6 months?  No. Has the patient been a risk to self within the distant past?  No. Is the patient a risk to others?  No. Has the patient been a risk to others in the past 6 months?  No. Has the patient been a risk to others within the  distant past?  No.  Current Medications: Current Outpatient Prescriptions  Medication Sig Dispense Refill  . ARIPiprazole (ABILIFY) 5 MG tablet Take 1 tablet (5 mg total) by mouth daily. Mood control 30 tablet 1  . citalopram (CELEXA) 40 MG tablet Take 1 tablet (40 mg total) by mouth daily. For depression 30 tablet 1  . lamoTRIgine (LAMICTAL) 150 MG tablet     . traZODone (DESYREL) 50 MG tablet Take 1 tablet (50 mg) at bedtime: For sleep 30 tablet 1   No current facility-administered medications for this visit.    Medical Decision Making:  Established Problem, Stable/Improving (1), Review of Medication Regimen & Side Effects (2) and Review of New Medication or Change in Dosage (2)  Treatment Plan Summary:Medication management and Plan   Bipolar disorder type II, most recent episode depressed   Continue the patient's citalopram at 40 mg daily Continue Lamictal at 150 mg daily. Continue Abilify at 5 mg daily Continue trazodone at 50 mg at bedtime as needed daily Recommend that patient increase exercise to 3-4 times a week Continue To see his therapist.   RTC in 1 month.  Marland Kitchen.Alyssamarie Mounsey 05/06/2016, 3:03 PM

## 2016-05-20 ENCOUNTER — Other Ambulatory Visit (HOSPITAL_COMMUNITY): Payer: Self-pay | Admitting: Psychiatry

## 2016-05-20 MED ORDER — CITALOPRAM HYDROBROMIDE 40 MG PO TABS: 40.0000 mg | ORAL_TABLET | Freq: Every day | ORAL | Status: DC

## 2016-06-07 ENCOUNTER — Ambulatory Visit: Payer: Self-pay | Admitting: Psychiatry

## 2016-06-16 IMAGING — CR DG ANKLE COMPLETE 3+V*L*
1 series · 3 of 3 positions shown · non-contrast
Comparison: None.

CLINICAL DATA: Acute onset of left ankle pain after twisting
injury. Initial encounter.

EXAM:
LEFT ANKLE COMPLETE - 3+ VIEW

[Series 1: dg ankle complete left · 0.14mm/px · 3 of 3 slices shown]
[im 1/3]
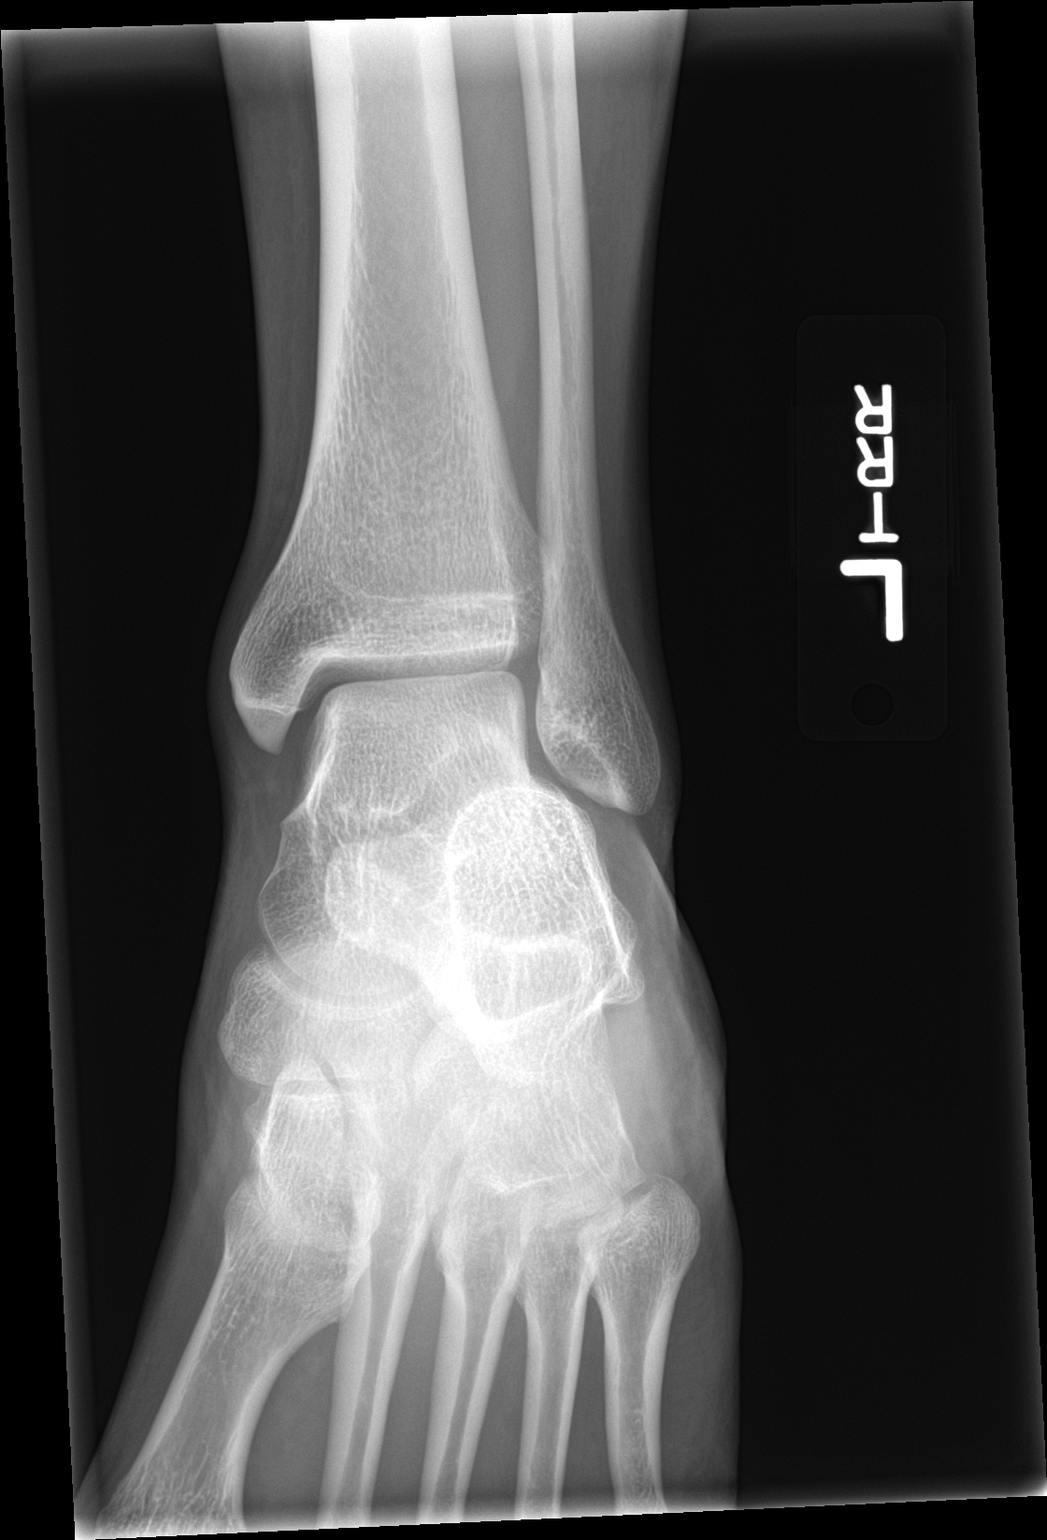
[im 2/3]
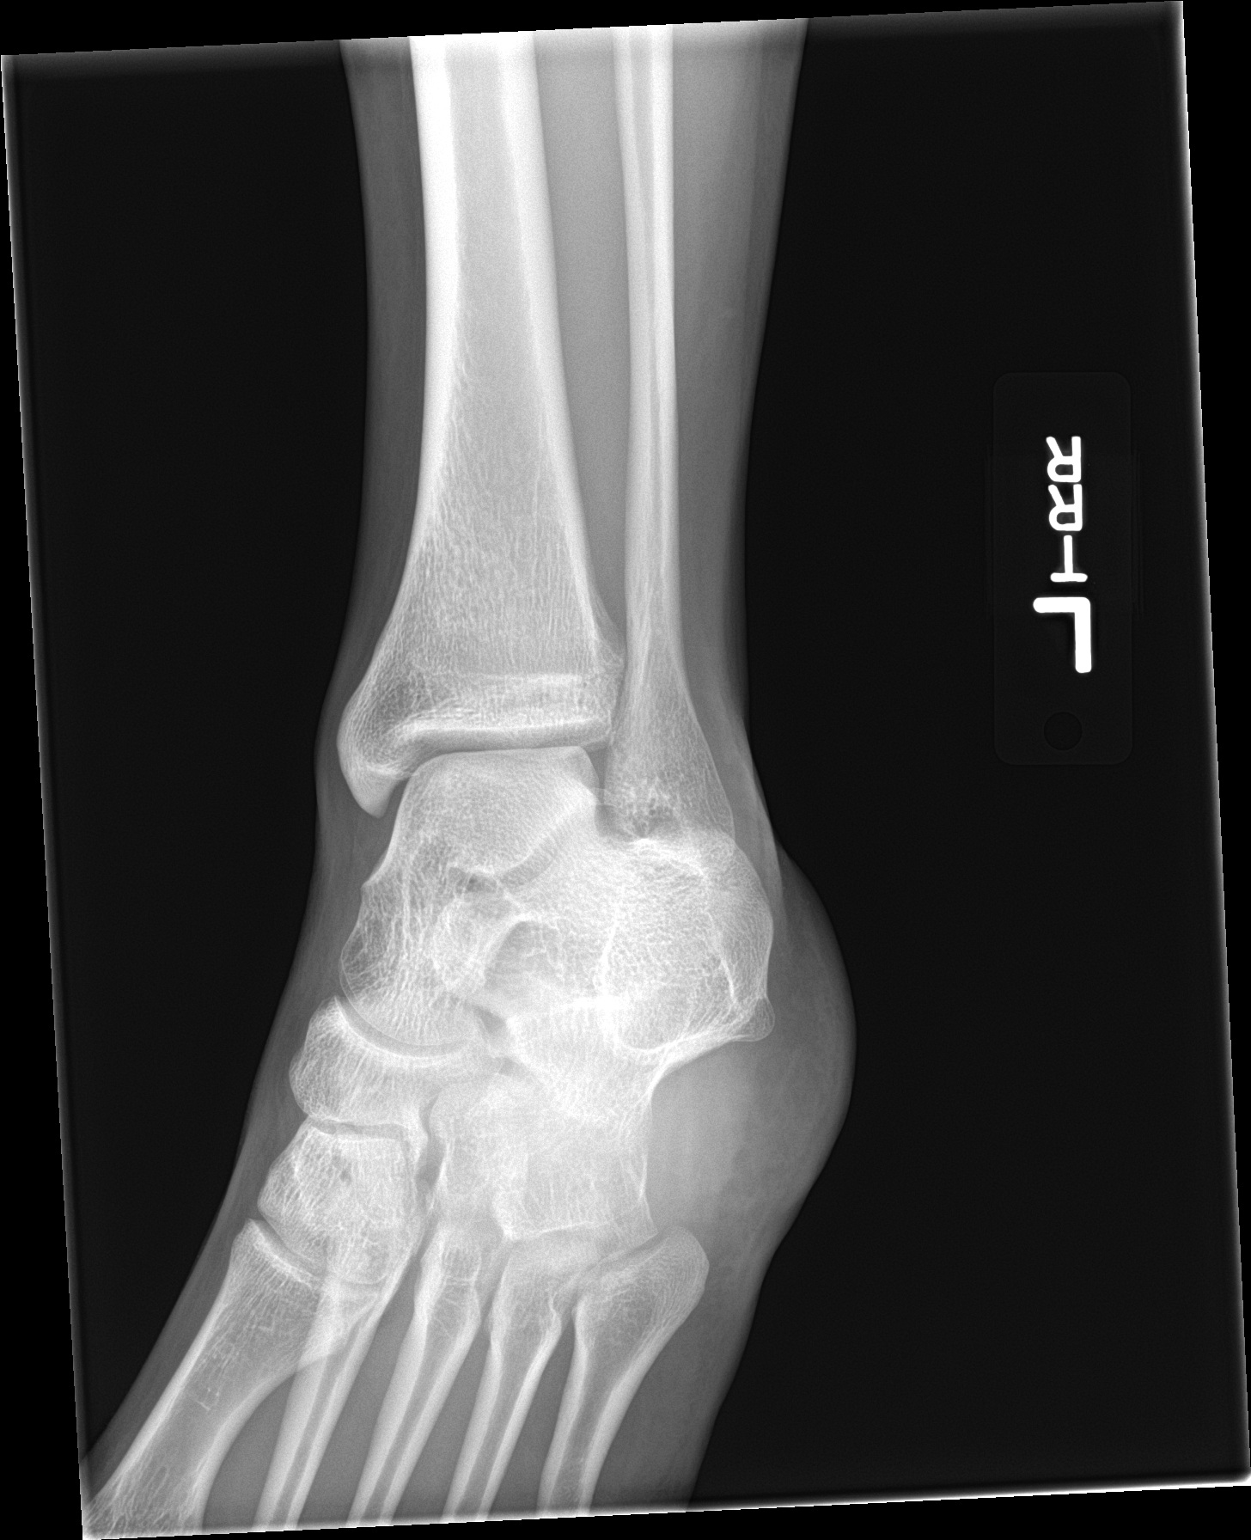
[im 3/3]
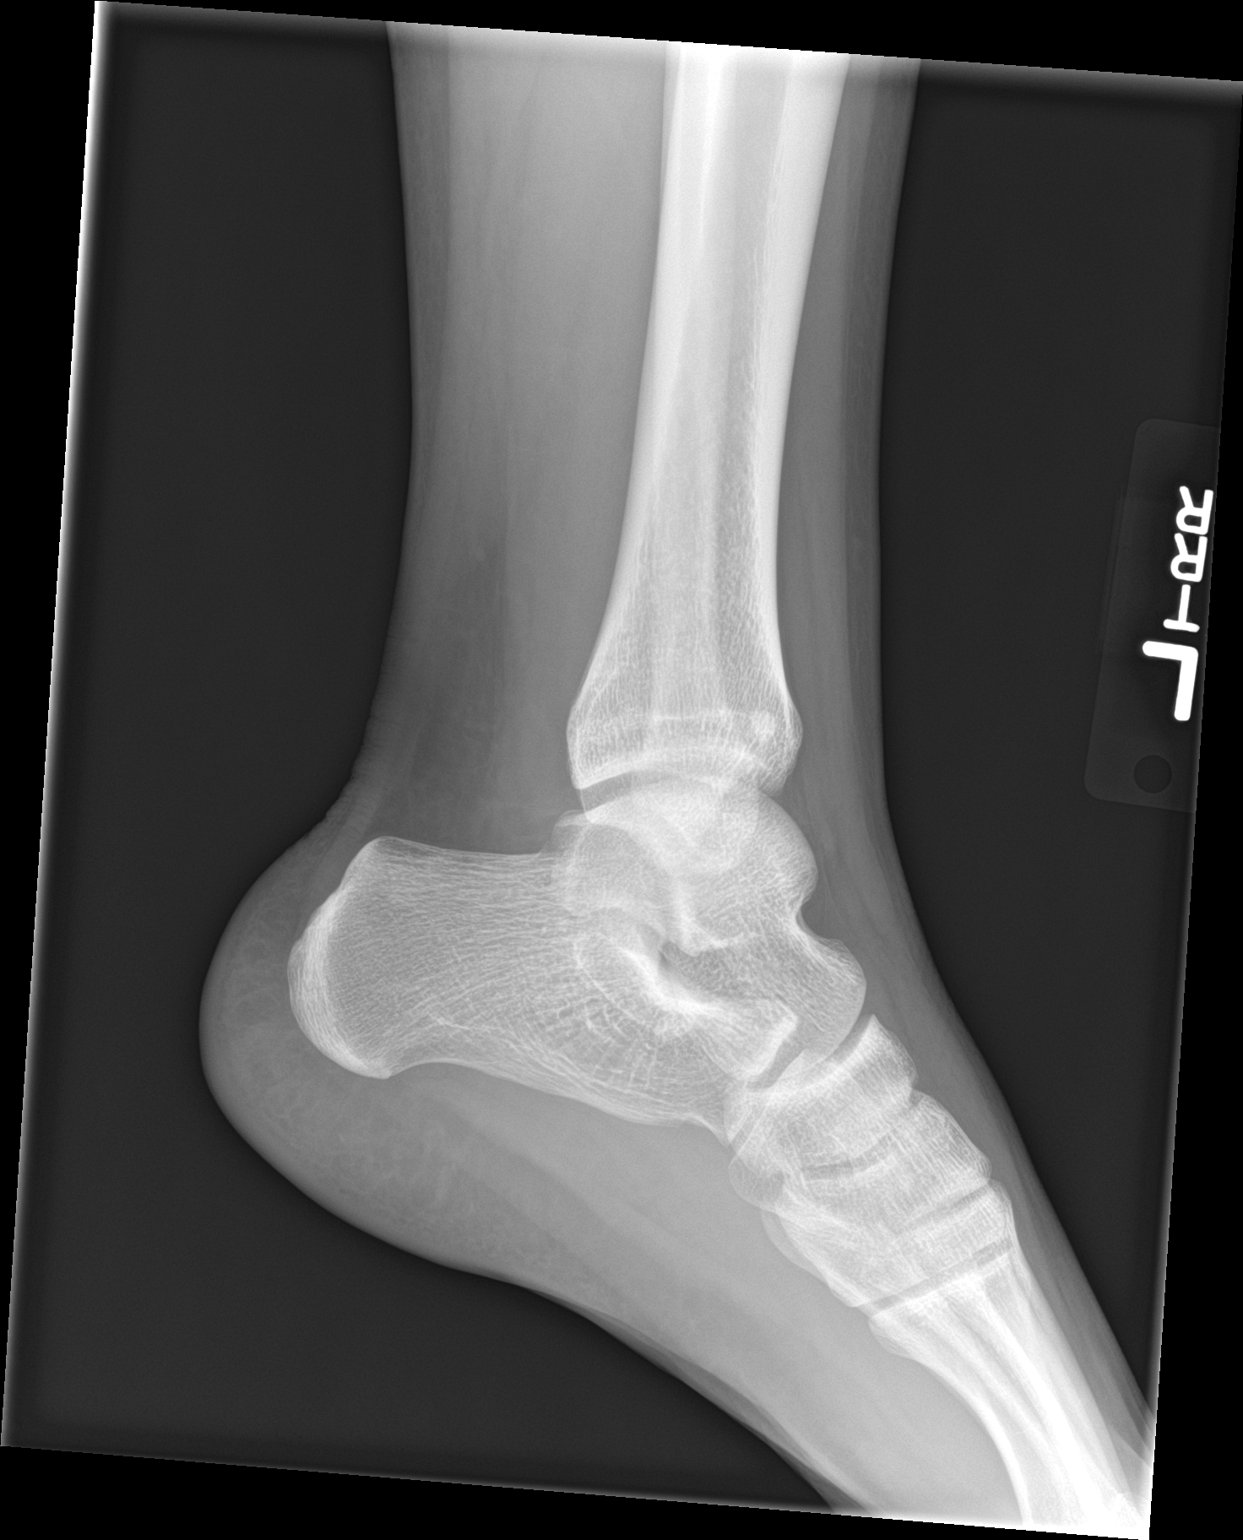

[3 of 3 positions shown; findings below may reference images not displayed]

FINDINGS: There is no evidence of fracture or dislocation. The ankle mortise
is intact; the interosseous space is within normal limits. No talar
tilt or subluxation is seen.

The joint spaces are preserved. No significant soft tissue
abnormalities are seen.
IMPRESSION: No evidence of fracture or dislocation.

## 2016-07-27 ENCOUNTER — Telehealth: Payer: Self-pay

## 2016-07-27 ENCOUNTER — Other Ambulatory Visit (HOSPITAL_COMMUNITY): Payer: Self-pay | Admitting: Psychiatry

## 2016-07-27 NOTE — Telephone Encounter (Signed)
pt states he needs a refill on his abilify.

## 2016-07-27 NOTE — Telephone Encounter (Signed)
called in ok to refill abilify no add refill pt will need to come to upcoming appt for add refills

## 2016-08-11 ENCOUNTER — Encounter: Payer: Self-pay | Admitting: Psychiatry

## 2016-08-11 ENCOUNTER — Ambulatory Visit (INDEPENDENT_AMBULATORY_CARE_PROVIDER_SITE_OTHER): Payer: 59 | Admitting: Psychiatry

## 2016-08-11 VITALS — BP 119/74 | HR 66 | Temp 98.0°F | Ht 69.0 in | Wt 167.8 lb

## 2016-08-11 DIAGNOSIS — F3130 Bipolar disorder, current episode depressed, mild or moderate severity, unspecified: Secondary | ICD-10-CM

## 2016-08-11 MED ORDER — ARIPIPRAZOLE 5 MG PO TABS: 5.0000 mg | ORAL_TABLET | Freq: Every day | ORAL | 1 refills | Status: DC

## 2016-08-11 MED ORDER — TRAZODONE HCL 50 MG PO TABS: ORAL_TABLET | ORAL | 1 refills | Status: DC

## 2016-08-11 MED ORDER — LAMOTRIGINE 150 MG PO TABS: 150.0000 mg | ORAL_TABLET | Freq: Every day | ORAL | 1 refills | Status: DC

## 2016-08-11 MED ORDER — CITALOPRAM HYDROBROMIDE 40 MG PO TABS: 40.0000 mg | ORAL_TABLET | Freq: Every day | ORAL | 1 refills | Status: DC

## 2016-08-11 NOTE — Progress Notes (Signed)
Patient ID: Jerry Mccarthy, male   DOB: Jul 26, 1991, 25 y.o.   MRN: [id number] [facility] Note  08/11/2016 11:15 AM Jerry Mccarthy  MRN:  [id number]  Subjective:  Patient returns follow-up as bipolar disorder I for follow-up appointment. Reports he is doing well in terms of his mood. 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 Patient reports is quite excited for that. Reports that he feels currently he is not ready to go back to school and would rather continue to work. Reports being compliant with his medications. When asked about his alcohol use states that he has been drinking 3-4 times and has 4-5 beers each time. States that he is living with his grandmother and she has warned him about his alcohol use. Denies any problems with his mood. States his sleeping well and eating well. He plays softball 2-3 times a week. Denies any suicidal thoughts.  Chief Complaint: Doing well   Visit Diagnosis:     ICD-9-CM ICD-10-CM   1. Bipolar I disorder, most recent episode depressed (HCC) 296.50 F31.30     Past Medical History:  Past Medical History:  Diagnosis Date  . ADHD (attention deficit hyperactivity disorder)   . Anxiety and depression   . Bipolar 2 disorder (HCC)    No past surgical history on file. Family History:  Family History  Problem Relation Age of Onset  . Depression Mother   . Cancer Maternal Grandmother   . Bipolar disorder Maternal Grandmother   . Lung cancer Paternal Grandmother   . Hyperlipidemia Father    Social History:  Social History   Social History  . Marital status: Single    Spouse name: N/A  . Number of children: 0  . Years of education: College   Occupational History  .  Other    [employer]   Social History Main Topics  . Smoking status: Current Every Day Smoker    Packs/day: 0.25    Types: Cigarettes    Start date: 12/25/2014  . Smokeless tobacco: Former NeurosurgeonUser    Types:  Chew  . Alcohol use No     Comment: a fifth 3-4 times a week  . Drug use:     Types: Marijuana     Comment: last used on 12-18-15  . Sexual activity: Yes    Birth control/ protection: Condom   Other Topics Concern  . Not on file   Social History Narrative   [facility] [employer].   Caffeine Use: 2 cups daily   Additional History:   Assessment:   Musculoskeletal: Strength & Muscle Tone: within normal limits Gait & Station: normal Patient leans: N/A  Psychiatric Specialty Exam: Anxiety     Depression         Past medical history includes anxiety.     Review of Systems  Constitutional: Negative.   HENT: Negative.   Eyes: Negative.   Respiratory: Negative.   Cardiovascular: Negative.   Gastrointestinal: Negative.   Genitourinary: Negative.   Musculoskeletal: Negative.   Skin: Negative.   Neurological: Negative.   Endo/Heme/Allergies: Negative.   Psychiatric/Behavioral: Positive for depression.  All other systems reviewed and are negative.   There were no vitals taken for this visit.There is no height or weight on file to calculate BMI.  General Appearance: Well Groomed  Eye Contact:  Good  Speech:  Normal Rate  Volume:  Normal  Mood:  Good   Affect:  Congruent  Thought Process:  Linear and Logical  Orientation:  Full (Time, Place, and Person)  Thought Content:  Negative  Suicidal Thoughts:  No  Homicidal Thoughts:  No  Memory:  Immediate;   Good Recent;   Good Remote;   Good  Judgement:  Good  Insight:  Good  Psychomotor Activity:  Negative  Concentration:  Good  Recall:  Good  Fund of Knowledge: Good  Language: Good  Akathisia:  Negative  Handed:  Right unknown   AIMS (if indicated):  Done 09/01/15 normal  Assets:  Communication Skills Desire for Improvement Social Support  ADL's:  Intact  Cognition: WNL  Sleep:  Good    Is the patient at risk to self?  No. Has the patient been a risk to self in the past 6 months?   No. Has the patient been a risk to self within the distant past?  No. Is the patient a risk to others?  No. Has the patient been a risk to others in the past 6 months?  No. Has the patient been a risk to others within the distant past?  No.  Current Medications: Current Outpatient Prescriptions  Medication Sig Dispense Refill  . ARIPiprazole (ABILIFY) 5 MG tablet Take 1 tablet (5 mg total) by mouth daily. Mood control 30 tablet 1  . citalopram (CELEXA) 40 MG tablet Take 1 tablet (40 mg total) by mouth daily. For depression 30 tablet 1  . lamoTRIgine (LAMICTAL) 150 MG tablet Take 1 tablet (150 mg total) by mouth daily. 30 tablet 1  . traZODone (DESYREL) 50 MG tablet Take 1 tablet (50 mg) at bedtime: For sleep 30 tablet 1   No current facility-administered medications for this visit.     Medical Decision Making:  Established Problem, Stable/Improving (1), Review of Medication Regimen & Side Effects (2) and Review of New Medication or Change in Dosage (2)  Treatment Plan Summary:Medication management and Plan   Bipolar disorder type II, most recent episode depressed   Continue the patient's citalopram at 40 mg daily Continue Lamictal at 150 mg daily. Continue Abilify at 5 mg daily Continue trazodone at 50 mg at bedtime as needed daily Recommend that patient increase exercise to 3-4 times a week Continue To see his therapist.   Alcohol abuse Patient counseled on the effects of alcohol on his mood and as a trigger for his bipolar disorder. Discussed the interactions between alcohol and medications on his liver and long-term consequences of alcohol abuse. Recommend that patient continue to taper off his alcohol use.  RTC in 2 months.    Marland KitchenRAVI, Roux Brandy 08/11/2016, 11:15 AM

## 2016-09-15 ENCOUNTER — Encounter: Payer: Self-pay | Admitting: Family Medicine

## 2016-09-15 ENCOUNTER — Ambulatory Visit (INDEPENDENT_AMBULATORY_CARE_PROVIDER_SITE_OTHER): Payer: Self-pay | Admitting: Family Medicine

## 2016-09-15 VITALS — BP 102/58 | HR 63 | Temp 97.5°F | Resp 16 | Wt 168.0 lb

## 2016-09-15 DIAGNOSIS — K219 Gastro-esophageal reflux disease without esophagitis: Secondary | ICD-10-CM

## 2016-09-15 MED ORDER — SUCRALFATE 1 G PO TABS
1.0000 g | ORAL_TABLET | Freq: Three times a day (TID) | ORAL | 0 refills | Status: DC
Start: 2016-09-15 — End: 2017-09-06

## 2016-09-15 NOTE — Progress Notes (Signed)
Subjective:     Patient ID: Jerry Mccarthy, male   DOB: March 19, 1991, 25 y.o.   MRN: [id number]  HPI  Chief Complaint  Patient presents with  . Gastroesophageal Reflux    Patient comes in office today with conerns of acid reflux over the period of 2 weeks or more. Patient states that he has been taking otc Nexium with no relief.   . Cough    Patietn reports cough for the past two weeks or more, he states associated with cough he has had nausea/vomiting and diarrhea.   States he does eat late at night at times, smokes 0.5 ppd and has 4-5 alcoholic drinks/week. 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 Has awakened at night refluxing and vomits to relieve it.   Review of Systems     Objective:   Physical Exam  Constitutional: He appears well-developed and well-nourished. No distress.  Abdominal: Soft. Bowel sounds are normal. There is tenderness (mild in epigastric area). There is no guarding.       Assessment:    1. Gastroesophageal reflux disease, esophagitis presence not specified - H. pylori breath test - sucralfate (CARAFATE) 1 g tablet; Take 1 tablet (1 g total) by mouth 4 (four) times daily -  with meals and at bedtime.  Dispense: 28 tablet; Refill: 0    Plan:    Samples of Dexilant 60 mg # 15 to take daily. Discussed minimizing smoking/ETOH consumption. Do not eat 3 hours before going to bed and consider HOB elevation on 6 " blocks.

## 2016-09-15 NOTE — Patient Instructions (Addendum)
Do not eat 3 hours before going to bed. Minimize Alcohol intake and nicotine use. Consider putting the head of your bed on 6 inch blocks. Take Dexilant once daily (stop Nexium)  We will call you with lab result.

## 2016-09-17 ENCOUNTER — Telehealth: Payer: Self-pay | Admitting: Family Medicine

## 2016-09-17 NOTE — Telephone Encounter (Signed)
Ok to approve note? KW

## 2016-09-17 NOTE — Telephone Encounter (Signed)
Ok for note but the diagnosis is GERD

## 2016-09-17 NOTE — Telephone Encounter (Signed)
Patient advised note up front for pick up. KW

## 2016-09-17 NOTE — Telephone Encounter (Signed)
Pt needs a note for work for yesterday and today.  He was in Wednesday and is still having stomach problems.  Please advise.  He will come pick note up.  His call back is [phone]  Thanks Jerry Mccarthy

## 2016-09-19 LAB — H. PYLORI BREATH TEST: H. pylori UBiT: NEGATIVE

## 2016-09-20 ENCOUNTER — Telehealth: Payer: Self-pay | Admitting: Family Medicine

## 2016-09-20 NOTE — Telephone Encounter (Signed)
Patient was advised of lab report. KW 

## 2016-09-20 NOTE — Telephone Encounter (Signed)
Pt called wanting to know if his test results from the [facility] are back yet.  His call back is [phone]  Thank sTeri

## 2016-09-22 ENCOUNTER — Telehealth: Payer: Self-pay

## 2016-09-22 DIAGNOSIS — R1084 Generalized abdominal pain: Secondary | ICD-10-CM

## 2016-09-22 NOTE — Telephone Encounter (Signed)
Left message to call back  

## 2016-09-22 NOTE — Telephone Encounter (Signed)
-----   Message from Malva Limesonald E Fisher, MD sent at 09/21/2016  3:44 PM EDT ----- H. Pylori test is negative. Was given carafate and dexilant by bob at [facility] Last week. If doing better we can get refills of these medications. If not then may need referral to GI.

## 2016-09-23 NOTE — Telephone Encounter (Signed)
Order has been placed for GI. Please contact patient once this has been scheduled. Thanks!

## 2016-09-23 NOTE — Telephone Encounter (Signed)
LMOVM for pt to return call 

## 2016-09-23 NOTE — Telephone Encounter (Signed)
Patient advised and states he is still having abdominal pain. He wants to proceed with referral. Please place order and send to sarah. Thanks

## 2016-09-30 ENCOUNTER — Encounter: Payer: Self-pay | Admitting: Psychiatry

## 2016-09-30 ENCOUNTER — Encounter: Payer: Self-pay | Admitting: Family Medicine

## 2016-09-30 ENCOUNTER — Ambulatory Visit (INDEPENDENT_AMBULATORY_CARE_PROVIDER_SITE_OTHER): Payer: 59 | Admitting: Psychiatry

## 2016-09-30 ENCOUNTER — Ambulatory Visit (INDEPENDENT_AMBULATORY_CARE_PROVIDER_SITE_OTHER): Payer: Self-pay | Admitting: Family Medicine

## 2016-09-30 ENCOUNTER — Telehealth: Payer: Self-pay | Admitting: Family Medicine

## 2016-09-30 VITALS — BP 131/83 | HR 68 | Temp 97.9°F | Wt 172.2 lb

## 2016-09-30 VITALS — BP 114/64 | HR 74 | Temp 98.4°F | Resp 16 | Wt 174.0 lb

## 2016-09-30 DIAGNOSIS — K219 Gastro-esophageal reflux disease without esophagitis: Secondary | ICD-10-CM

## 2016-09-30 DIAGNOSIS — F3130 Bipolar disorder, current episode depressed, mild or moderate severity, unspecified: Secondary | ICD-10-CM

## 2016-09-30 DIAGNOSIS — R110 Nausea: Secondary | ICD-10-CM

## 2016-09-30 MED ORDER — TRAZODONE HCL 50 MG PO TABS: ORAL_TABLET | ORAL | 2 refills | Status: DC

## 2016-09-30 MED ORDER — ARIPIPRAZOLE 5 MG PO TABS: 5.0000 mg | ORAL_TABLET | Freq: Every day | ORAL | 2 refills | Status: DC

## 2016-09-30 MED ORDER — LAMOTRIGINE 150 MG PO TABS: 150.0000 mg | ORAL_TABLET | Freq: Every day | ORAL | 2 refills | Status: DC

## 2016-09-30 MED ORDER — CITALOPRAM HYDROBROMIDE 40 MG PO TABS: 40.0000 mg | ORAL_TABLET | Freq: Every day | ORAL | 2 refills | Status: DC

## 2016-09-30 NOTE — Telephone Encounter (Signed)
FYI--Dr Wohl's office has made 2 attempts to contact pt concerning GI referral.Pt has not respondedI have also left message

## 2016-09-30 NOTE — Progress Notes (Signed)
Subjective:     Patient ID: Jerry Mccarthy, male   DOB: 09/17/1991, 25 y.o.   MRN: [id number]  HPI  Chief Complaint  Patient presents with  . Gastroesophageal Reflux    Patient is present in office today for two week follow up, patient was last seen 09/15/16 and started on Carfate 1gm and Dexilant 60mg . Patient states that he has noticed improvement on medication but complains of severe nausea in the morning.   States nausea will last until the afternoon. No vomiting. States abdominal pain and heartburn have resolved. He has avoided alcohol. Took the last Dexilant last night.   Review of Systems     Objective:   Physical Exam  Constitutional: He appears well-developed and well-nourished. No distress.  Abdominal: Soft. There is no tenderness.       Assessment:    1. Gastroesophageal reflux disease, esophagitis presence not specified: improved  2. Nausea: ? Side effect to Dexilant. - Comprehensive metabolic panel - Lipase    Plan:    If nausea not improved off Dexilant to start Prevacid 30 mg. Otc. Further f/u pending lab work.

## 2016-09-30 NOTE — Progress Notes (Signed)
Patient ID: Jerry Mccarthy, male   DOB: 1991-05-14, 25 y.o.   MRN: [id number] [facility] Progress Note  09/30/2016 11:18 AM Jerry Columbiayler M Fix  MRN:  [id number]  Subjective:  Patient returns follow-up as bipolar disorder I for follow-up appointment. He reports that he was diagnosed with a gastric ulcer and has been taking treatment for it. States that he is also stop drinking over the past 2 weeks. Understands that he is not supposed to drink with his bipolar disorder. 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 States that sales have been slow and this has been depressing. Denies any suicidal thoughts. He has been compliant with his medications. Has not been seeing his therapist for the last 3-4 weeks.  Chief Complaint: Doing ok  Chief Complaint    Follow-up; Medication Refill     Visit Diagnosis:     ICD-9-CM ICD-10-CM   1. Bipolar I disorder, most recent episode depressed (HCC) 296.50 F31.30     Past Medical History:  Past Medical History:  Diagnosis Date  . ADHD (attention deficit hyperactivity disorder)   . Anxiety and depression   . Bipolar 2 disorder (HCC)    History reviewed. No pertinent surgical history. Family History:  Family History  Problem Relation Age of Onset  . Depression Mother   . Cancer Maternal Grandmother   . Bipolar disorder Maternal Grandmother   . Lung cancer Paternal Grandmother   . Hyperlipidemia Father    Social History:  Social History   Social History  . Marital status: Single    Spouse name: N/A  . Number of children: 0  . Years of education: College   Occupational History  .  Other    [employer]   Social History Main Topics  . Smoking status: Current Every Day Smoker    Packs/day: 0.25    Types: Cigarettes    Start date: 12/25/2014  . Smokeless tobacco: Former NeurosurgeonUser    Types: Chew  . Alcohol use No     Comment: a fifth 3-4 times a week  . Drug use:     Types: Marijuana     Comment: last used on 12-18-15  . Sexual  activity: Yes    Birth control/ protection: Condom   Other Topics Concern  . None   Social History Narrative   Patient [facility] [employer].   Caffeine Use: 2 cups daily   Additional History:   Assessment:   Musculoskeletal: Strength & Muscle Tone: within normal limits Gait & Station: normal Patient leans: N/A  Psychiatric Specialty Exam: Anxiety     Depression         Past medical history includes anxiety.   Medication Refill     Review of Systems  Constitutional: Negative.   HENT: Negative.   Eyes: Negative.   Respiratory: Negative.   Cardiovascular: Negative.   Gastrointestinal: Negative.   Genitourinary: Negative.   Musculoskeletal: Negative.   Skin: Negative.   Neurological: Negative.   Endo/Heme/Allergies: Negative.   Psychiatric/Behavioral: Positive for depression.  All other systems reviewed and are negative.   Blood pressure 131/83, pulse 68, temperature 97.9 F (36.6 C), temperature source Oral, weight 172 lb 3.2 oz (78.1 kg).Body mass index is 25.43 kg/m.  General Appearance: Well Groomed  Eye Contact:  Good  Speech:  Normal Rate  Volume:  Normal  Mood:  down  Affect:  Congruent  Thought Process:  Linear and Logical  Orientation:  Full (Time, Place,  and Person)  Thought Content:  Negative  Suicidal Thoughts:  No  Homicidal Thoughts:  No  Memory:  Immediate;   Good Recent;   Good Remote;   Good  Judgement:  Good  Insight:  Good  Psychomotor Activity:  Negative  Concentration:  Good  Recall:  Good  Fund of Knowledge: Good  Language: Good  Akathisia:  Negative  Handed:  Right unknown   AIMS (if indicated):  Done 09/01/15 normal  Assets:  Communication Skills Desire for Improvement Social Support  ADL's:  Intact  Cognition: WNL  Sleep:  Good    Is the patient at risk to self?  No. Has the patient been a risk to self in the past 6 months?  No. Has the patient been a risk to self within the distant past?  No. Is the  patient a risk to others?  No. Has the patient been a risk to others in the past 6 months?  No. Has the patient been a risk to others within the distant past?  No.  Current Medications: Current Outpatient Prescriptions  Medication Sig Dispense Refill  . ARIPiprazole (ABILIFY) 5 MG tablet Take 1 tablet (5 mg total) by mouth daily. Mood control 30 tablet 1  . citalopram (CELEXA) 40 MG tablet Take 1 tablet (40 mg total) by mouth daily. For depression 30 tablet 1  . lamoTRIgine (LAMICTAL) 150 MG tablet Take 1 tablet (150 mg total) by mouth daily. 30 tablet 1  . sucralfate (CARAFATE) 1 g tablet Take 1 tablet (1 g total) by mouth 4 (four) times daily -  with meals and at bedtime. 28 tablet 0  . traZODone (DESYREL) 50 MG tablet Take 1 tablet (50 mg) at bedtime: For sleep 30 tablet 1   No current facility-administered medications for this visit.     Medical Decision Making:  Established Problem, Stable/Improving (1), Review of Medication Regimen & Side Effects (2) and Review of New Medication or Change in Dosage (2)  Treatment Plan Summary:Medication management and Plan   Bipolar disorder type II, most recent episode depressed   Continue the patient's citalopram at 40 mg daily Continue Lamictal at 150 mg daily. Continue Abilify at 5 mg daily Continue trazodone at 50 mg at bedtime as needed daily Recommend that patient increase exercise to 3-4 times a week Continue To see his therapist, Recommend that he not miss his appointments with his therapist.  Alcohol abuse Patient counseled on the effects of alcohol on his mood and as a trigger for his bipolar disorder. Discussed the interactions between alcohol and medications on his liver and long-term consequences of alcohol abuse. Recommend that patient continue to taper off his alcohol use.  RTC in 1 months.    Marland KitchenRAVI, Loren Sawaya 09/30/2016, 11:18 AM

## 2016-09-30 NOTE — Patient Instructions (Signed)
Start two Prevacid 15 mg.daily for two weeks over the counter. We will call you with the lab work.

## 2016-09-30 NOTE — Telephone Encounter (Signed)
[id number] 

## 2016-10-01 LAB — COMPREHENSIVE METABOLIC PANEL
ALK PHOS: 59 IU/L (ref 39–117)
ALT: 22 IU/L (ref 0–44)
AST: 14 IU/L (ref 0–40)
Albumin/Globulin Ratio: 1.8 (ref 1.2–2.2)
Albumin: 4.9 g/dL (ref 3.5–5.5)
BILIRUBIN TOTAL: 0.3 mg/dL (ref 0.0–1.2)
BUN/Creatinine Ratio: 14 (ref 9–20)
BUN: 15 mg/dL (ref 6–20)
CHLORIDE: 98 mmol/L (ref 96–106)
CO2: 26 mmol/L (ref 18–29)
Calcium: 10 mg/dL (ref 8.7–10.2)
Creatinine, Ser: 1.1 mg/dL (ref 0.76–1.27)
GFR calc Af Amer: 108 mL/min/{1.73_m2} (ref >59)
GFR calc non Af Amer: 93 mL/min/{1.73_m2} (ref >59)
GLUCOSE: 79 mg/dL (ref 65–99)
Globulin, Total: 2.7 g/dL (ref 1.5–4.5)
Potassium: 4.5 mmol/L (ref 3.5–5.2)
Sodium: 141 mmol/L (ref 134–144)
Total Protein: 7.6 g/dL (ref 6.0–8.5)

## 2016-10-01 LAB — LIPASE: LIPASE: 46 U/L (ref 13–78)

## 2016-10-18 ENCOUNTER — Ambulatory Visit: Payer: Self-pay | Admitting: Family Medicine

## 2016-10-26 ENCOUNTER — Ambulatory Visit (INDEPENDENT_AMBULATORY_CARE_PROVIDER_SITE_OTHER): Payer: Self-pay | Admitting: Psychiatry

## 2016-10-26 ENCOUNTER — Encounter: Payer: Self-pay | Admitting: Psychiatry

## 2016-10-26 VITALS — BP 142/83 | HR 75 | Temp 97.6°F | Resp 18 | Wt 167.2 lb

## 2016-10-26 DIAGNOSIS — F3130 Bipolar disorder, current episode depressed, mild or moderate severity, unspecified: Secondary | ICD-10-CM

## 2016-10-26 NOTE — Progress Notes (Signed)
Patient ID: Jerry Mccarthy, male   DOB: 10-03-1991, 25 y.o.   MRN: [id number] [facility] OP Progress Note  10/26/2016 10:34 AM Jerry Mccarthy  MRN:  [id number]  Subjective:  Patient returns follow-up as bipolar disorder I for follow-up appointment. Patient today reports that he has not doing well. States that he has been drinking excessively since his last appointment. Reports that he has gotten into legal trouble over an incident with dating minor girl on the Internet and also having an open container in his car. States that he was supposed to be doing community service for open container incident but he did not do that. He has a court appointment on the 20th of this month. Reports feeling stressed about it. States his been drinking beer multiple times and also hard liquor. He has not been compliant with his medications. States that he restarted his medications about 2 days ago and has already begin to feel better.  Denies any suicidal thoughts. States he got in touch with this therapist and will start to see them.  Chief Complaint: Doing ok today but not so good over the past few weeks   Visit Diagnosis:     ICD-9-CM ICD-10-CM   1. Bipolar I disorder, most recent episode depressed (HCC) 296.50 F31.30     Past Medical History:  Past Medical History:  Diagnosis Date  . ADHD (attention deficit hyperactivity disorder)   . Anxiety and depression   . Bipolar 2 disorder (HCC)    No past surgical history on file. Family History:  Family History  Problem Relation Age of Onset  . Depression Mother   . Cancer Maternal Grandmother   . Bipolar disorder Maternal Grandmother   . Lung cancer Paternal Grandmother   . Hyperlipidemia Father    Social History:  Social History   Social History  . Marital status: Single    Spouse name: N/A  . Number of children: 0  . Years of education: College   Occupational History  .  Other    [employer]   Social History Main Topics  . Smoking  status: Current Every Day Smoker    Packs/day: 0.25    Types: Cigarettes    Start date: 12/25/2014  . Smokeless tobacco: Former NeurosurgeonUser    Types: Chew  . Alcohol use No     Comment: a fifth 3-4 times a week  . Drug use:     Types: Marijuana     Comment: last used on 12-18-15  . Sexual activity: Yes    Birth control/ protection: Condom   Other Topics Concern  . Not on file   Social History Narrative   Patient [facility] [employer].   Caffeine Use: 2 cups daily   Additional History:   Assessment:   Musculoskeletal: Strength & Muscle Tone: within normal limits Gait & Station: normal Patient leans: N/A  Psychiatric Specialty Exam: Medication Refill   Anxiety     Depression         Past medical history includes anxiety.     Review of Systems  Constitutional: Negative.   HENT: Negative.   Eyes: Negative.   Respiratory: Negative.   Cardiovascular: Negative.   Gastrointestinal: Negative.   Genitourinary: Negative.   Musculoskeletal: Negative.   Skin: Negative.   Neurological: Negative.   Endo/Heme/Allergies: Negative.   Psychiatric/Behavioral: Positive for depression.  All other systems reviewed and are negative.   There were no vitals taken for this visit.There is no height  or weight on file to calculate BMI.  General Appearance: Well Groomed  Eye Contact:  Good  Speech:  Normal Rate  Volume:  Normal  Mood:  down  Affect:  Congruent  Thought Process:  Linear and Logical  Orientation:  Full (Time, Place, and Person)  Thought Content:  Negative  Suicidal Thoughts:  No  Homicidal Thoughts:  No  Memory:  Immediate;   Good Recent;   Good Remote;   Good  Judgement:  Good  Insight:  Good  Psychomotor Activity:  Negative  Concentration:  Good  Recall:  Good  Fund of Knowledge: Good  Language: Good  Akathisia:  Negative  Handed:  Right unknown   AIMS (if indicated):  Done 09/01/15 normal  Assets:  Communication Skills Desire for  Improvement Social Support  ADL's:  Intact  Cognition: WNL  Sleep:  Good    Is the patient at risk to self?  No. Has the patient been a risk to self in the past 6 months?  No. Has the patient been a risk to self within the distant past?  No. Is the patient a risk to others?  No. Has the patient been a risk to others in the past 6 months?  No. Has the patient been a risk to others within the distant past?  No.  Current Medications: Current Outpatient Prescriptions  Medication Sig Dispense Refill  . ARIPiprazole (ABILIFY) 5 MG tablet Take 1 tablet (5 mg total) by mouth daily. Mood control 30 tablet 2  . citalopram (CELEXA) 40 MG tablet Take 1 tablet (40 mg total) by mouth daily. For depression 30 tablet 2  . lamoTRIgine (LAMICTAL) 150 MG tablet Take 1 tablet (150 mg total) by mouth daily. 30 tablet 2  . sucralfate (CARAFATE) 1 g tablet Take 1 tablet (1 g total) by mouth 4 (four) times daily -  with meals and at bedtime. 28 tablet 0  . traZODone (DESYREL) 50 MG tablet Take 1 tablet (50 mg) at bedtime: For sleep 30 tablet 2   No current facility-administered medications for this visit.     Medical Decision Making:  Established Problem, Stable/Improving (1), Review of Medication Regimen & Side Effects (2) and Review of New Medication or Change in Dosage (2)  Treatment Plan Summary:Medication management and Plan   Bipolar disorder type II, most recent episode depressed   Continue the patient's citalopram at 40 mg daily Continue Lamictal at 150 mg daily. Continue Abilify at 5 mg daily Continue trazodone at 50 mg at bedtime as needed daily Recommend that patient increase exercise to 3-4 times a week Continue To see his therapist, Recommend that he not miss his appointments with his therapist.  Alcohol abuse Patient counseled on the effects of alcohol on his mood and as a trigger for his bipolar disorder. Discussed the interactions between alcohol and medications on his liver and  long-term consequences of alcohol abuse. Recommend that patient continue to taper off his alcohol use.   Recommend that patient start intensive outpatient treatment given his level of alcohol abuse and bipolar symptoms. However patient states that may not be possible at this time.  RTC in 2 weeks or call before if necessary    .Jeryn Bertoni 10/26/2016, 10:34 AM

## 2016-11-11 ENCOUNTER — Ambulatory Visit: Payer: Self-pay | Admitting: Psychiatry

## 2017-01-04 ENCOUNTER — Ambulatory Visit: Payer: Self-pay | Admitting: Psychiatry

## 2017-02-05 ENCOUNTER — Other Ambulatory Visit: Payer: Self-pay | Admitting: Psychiatry

## 2017-02-09 ENCOUNTER — Encounter: Payer: Self-pay | Admitting: Psychiatry

## 2017-02-09 ENCOUNTER — Ambulatory Visit (INDEPENDENT_AMBULATORY_CARE_PROVIDER_SITE_OTHER): Payer: Self-pay | Admitting: Psychiatry

## 2017-02-09 VITALS — BP 124/84 | HR 70 | Temp 97.8°F | Wt 168.4 lb

## 2017-02-09 DIAGNOSIS — F3130 Bipolar disorder, current episode depressed, mild or moderate severity, unspecified: Secondary | ICD-10-CM

## 2017-02-09 MED ORDER — TRAZODONE HCL 50 MG PO TABS: ORAL_TABLET | ORAL | 2 refills | Status: DC

## 2017-02-09 MED ORDER — CITALOPRAM HYDROBROMIDE 20 MG PO TABS: 20.0000 mg | ORAL_TABLET | Freq: Every day | ORAL | 1 refills | Status: DC

## 2017-02-09 MED ORDER — LAMOTRIGINE 150 MG PO TABS: 150.0000 mg | ORAL_TABLET | Freq: Every day | ORAL | 2 refills | Status: DC

## 2017-02-09 MED ORDER — ARIPIPRAZOLE 10 MG PO TABS: 10.0000 mg | ORAL_TABLET | Freq: Every day | ORAL | 1 refills | Status: DC

## 2017-02-09 NOTE — Progress Notes (Signed)
Patient ID: Jerry Mccarthy, male   DOB: December 01, 1991, 26 y.o.   MRN: [id number] [facility] MD/PA/NP OP Progress Note  02/09/2017 3:09 PM Jerry Mccarthy  MRN:  [id number]  Subjective:  Patient returns follow-up as bipolar disorder I for follow-up appointment. Patient has not been seen in more than 3 months. Reports that he has not been doing well. States that his been taking all his medications. The patient also endorses that he has been drinking. States that he most recently lost another job. Patient presenting very irritable. States that he has been having up and down episodes and thinks his medications need to be changed. Patient very focused on that. States that he just started seeing his therapist regularly. He understands that medications and alcohol should not be mixed and it can lead to adverse effects. Denies any suicidal thoughts. Does state that he might have felt manic for 1 day. Reports sleeping too much States that his court date was preferred treatment.  Patient is irritable and has a strong body odor. He was very insistent that his medications also needed to be changed but did agree that other factors in his life needed to change for him to feel better.   Chief Complaint: Not doing well   Visit Diagnosis:     ICD-9-CM ICD-10-CM   1. Bipolar I disorder, most recent episode depressed (HCC) 296.50 F31.30     Past Medical History:  Past Medical History:  Diagnosis Date  . ADHD (attention deficit hyperactivity disorder)   . Anxiety and depression   . Bipolar 2 disorder (HCC)    No past surgical history on file. Family History:  Family History  Problem Relation Age of Onset  . Depression Mother   . Cancer Maternal Grandmother   . Bipolar disorder Maternal Grandmother   . Lung cancer Paternal Grandmother   . Hyperlipidemia Father    Social History:  Social History   Social History  . Marital status: Single    Spouse name: N/A  . Number of children: 0  . Years of education:  College   Occupational History  .  Other    [employer]   Social History Main Topics  . Smoking status: Current Every Day Smoker    Packs/day: 0.25    Types: Cigarettes    Start date: 12/25/2014  . Smokeless tobacco: Former Neurosurgeon    Types: Chew  . Alcohol use No     Comment: a fifth 3-4 times a week  . Drug use: Yes    Types: Marijuana     Comment: last used on 12-18-15  . Sexual activity: Yes    Birth control/ protection: Condom   Other Topics Concern  . Not on file   Social History Narrative   Patient lives on campus at [facility].   Caffeine Use: 2 cups daily   Additional History:   Assessment:   Musculoskeletal: Strength & Muscle Tone: within normal limits Gait & Station: normal Patient leans: N/A  Psychiatric Specialty Exam: Medication Refill   Anxiety     Depression         Past medical history includes anxiety.     Review of Systems  Constitutional: Negative.   HENT: Negative.   Eyes: Negative.   Respiratory: Negative.   Cardiovascular: Negative.   Gastrointestinal: Negative.   Genitourinary: Negative.   Musculoskeletal: Negative.   Skin: Negative.   Neurological: Negative.   Endo/Heme/Allergies: Negative.   Psychiatric/Behavioral: Positive for depression.  All other systems reviewed  and are negative.   There were no vitals taken for this visit.There is no height or weight on file to calculate BMI.  General Appearance: Well Groomed  Eye Contact:  Good  Speech:  Normal Rate  Volume:  Loud   Mood:  Up and down   Affect:  Irritable   Thought Process:  Linear and Logical  Orientation:  Full (Time, Place, and Person)  Thought Content:  Negative  Suicidal Thoughts:  No  Homicidal Thoughts:  No  Memory:  Immediate;   Good Recent;   Good Remote;   Good  Judgement:  Good  Insight:  Good  Psychomotor Activity:  Negative  Concentration:  Good  Recall:  Good  Fund of Knowledge: Good  Language: Good  Akathisia:  Negative  Handed:   Right unknown   AIMS (if indicated):  Done 02/09/2017 normal  Assets:  Communication Skills Desire for Improvement Social Support  ADL's:  Intact  Cognition: WNL  Sleep:  Good    Is the patient at risk to self?  No. Has the patient been a risk to self in the past 6 months?  No. Has the patient been a risk to self within the distant past?  No. Is the patient a risk to others?  No. Has the patient been a risk to others in the past 6 months?  No. Has the patient been a risk to others within the distant past?  No.  Current Medications: Current Outpatient Prescriptions  Medication Sig Dispense Refill  . ARIPiprazole (ABILIFY) 5 MG tablet Take 1 tablet (5 mg total) by mouth daily. Mood control 30 tablet 2  . citalopram (CELEXA) 40 MG tablet Take 1 tablet (40 mg total) by mouth daily. For depression 30 tablet 2  . lamoTRIgine (LAMICTAL) 150 MG tablet Take 1 tablet (150 mg total) by mouth daily. 30 tablet 2  . sucralfate (CARAFATE) 1 g tablet Take 1 tablet (1 g total) by mouth 4 (four) times daily -  with meals and at bedtime. 28 tablet 0  . traZODone (DESYREL) 50 MG tablet Take 1 tablet (50 mg) at bedtime: For sleep 30 tablet 2   No current facility-administered medications for this visit.     Medical Decision Making:  Established Problem, Stable/Improving (1), Review of Medication Regimen & Side Effects (2) and Review of New Medication or Change in Dosage (2)  Treatment Plan Summary:Medication management and Plan   Bipolar disorder type II, most recent episode depressed   Decrease Celexa to 20 mg once daily. We discussed that it could also be adding to his irritability and patient feels it has not been helpful. Goal is to taper off completely. Continue Lamictal at 150 mg daily. Increase Abilify to 10 mg daily Continue trazodone at 50 mg at bedtime as needed daily Recommend that patient increase exercise to 3-4 times a week Continue To see his therapist, Recommend that he not miss his  appointments with his therapist.   Alcohol abuse Patient counseled on the effects of alcohol on his mood and as a trigger for his bipolar disorder. Discussed the interactions between alcohol and medications on his liver and long-term consequences of alcohol abuse. Recommend that patient continue to taper off his alcohol use. Discussed attending AA meetings   Recommend that patient start intensive outpatient treatment given his level of alcohol abuse and bipolar symptoms. However patient states that may not be possible at this time.  RTC in 2 weeks or call before if necessary    .  Caleen Taaffe 02/09/2017, 3:09 PM

## 2017-02-18 ENCOUNTER — Other Ambulatory Visit: Payer: Self-pay | Admitting: Family Medicine

## 2017-02-18 ENCOUNTER — Telehealth: Payer: Self-pay | Admitting: Family Medicine

## 2017-02-18 DIAGNOSIS — Z111 Encounter for screening for respiratory tuberculosis: Secondary | ICD-10-CM

## 2017-02-18 NOTE — Telephone Encounter (Signed)
Order for TB test has been sent in to [facility]

## 2017-02-18 NOTE — Telephone Encounter (Signed)
Please review for Dr Gilbert-aa 

## 2017-02-18 NOTE — Telephone Encounter (Signed)
Pt mom states he has been accepted to [facility] and will be going Wednesday (02/23/17)  And they need a TB test done in order for him to go.  I explained we do not do the skin test in the office; we normally do lab drawls.  She is requesting we send a lab order for him to have the test done.  Please contact the mother @ [phone]

## 2017-02-18 NOTE — Telephone Encounter (Signed)
Mother advised-aa 

## 2017-02-22 ENCOUNTER — Ambulatory Visit (INDEPENDENT_AMBULATORY_CARE_PROVIDER_SITE_OTHER): Payer: 59 | Admitting: Psychiatry

## 2017-02-22 ENCOUNTER — Encounter: Payer: Self-pay | Admitting: Psychiatry

## 2017-02-22 VITALS — BP 123/77 | HR 77 | Wt 172.6 lb

## 2017-02-22 DIAGNOSIS — F3130 Bipolar disorder, current episode depressed, mild or moderate severity, unspecified: Secondary | ICD-10-CM

## 2017-02-22 MED ORDER — LAMOTRIGINE 150 MG PO TABS: 150.0000 mg | ORAL_TABLET | Freq: Every day | ORAL | 1 refills | Status: DC

## 2017-02-22 MED ORDER — ARIPIPRAZOLE 10 MG PO TABS: 10.0000 mg | ORAL_TABLET | Freq: Every day | ORAL | 1 refills | Status: DC

## 2017-02-22 MED ORDER — CITALOPRAM HYDROBROMIDE 20 MG PO TABS: 20.0000 mg | ORAL_TABLET | Freq: Every day | ORAL | 1 refills | Status: DC

## 2017-02-22 NOTE — Progress Notes (Signed)
Patient ID: Jerry Mccarthy, male   DOB: 02/11/1991, 26 y.o.   MRN: [id number] [facility] MD/PA/NP OP Progress Note  02/22/2017 12:10 PM Jerry Mccarthy  MRN:  [id number]  Subjective:  Patient returns follow-up as bipolar disorder I for follow-up appointment. Patient reports that he is tolerating the new regimen of medications well. He was able to increase the Abilify to 10 mg and decrease the Celexa to 20 mg without any problems. States that he is feeling more stable mood wise. Reports that he is about to see his therapist tonight. Patient continues to have a very strong body odor. He then states that he is going to a three-month program in [place] starting tomorrow. States that his mom and his aunt have talked him into it and he has realized that he has been pushing away his family due to his behavior. He seems calm and confident about the program and the changes it'll help to bring in him. Denies any suicidal thoughts. States that he needs a 90 day supply of medications.  Chief Complaint: Will be attending a three-month program in [place] for substance abuse  Chief Complaint    Follow-up; Medication Refill     Visit Diagnosis:     ICD-9-CM ICD-10-CM   1. Bipolar I disorder, most recent episode depressed (HCC) 296.50 F31.30     Past Medical History:  Past Medical History:  Diagnosis Date  . ADHD (attention deficit hyperactivity disorder)   . Anxiety and depression   . Bipolar 2 disorder (HCC)    History reviewed. No pertinent surgical history. Family History:  Family History  Problem Relation Age of Onset  . Depression Mother   . Cancer Maternal Grandmother   . Bipolar disorder Maternal Grandmother   . Lung cancer Paternal Grandmother   . Hyperlipidemia Father    Social History:  Social History   Social History  . Marital status: Single    Spouse name: N/A  . Number of children: 0  . Years of education: College   Occupational History  .  Other    [employer]   Social History  Main Topics  . Smoking status: Current Every Day Smoker    Packs/day: 0.25    Types: Cigarettes    Start date: 12/25/2014  . Smokeless tobacco: Former Neurosurgeon    Types: Chew  . Alcohol use No     Comment: a fifth 3-4 times a week  . Drug use: Yes    Types: Marijuana     Comment: last used on 12-18-15  . Sexual activity: Yes    Birth control/ protection: Condom   Other Topics Concern  . None   Social History Narrative   Patient lives on campus at [facility].   Caffeine Use: 2 cups daily   Additional History:   Assessment:   Musculoskeletal: Strength & Muscle Tone: within normal limits Gait & Station: normal Patient leans: N/A  Psychiatric Specialty Exam: Medication Refill   Anxiety     Depression         Past medical history includes anxiety.     Review of Systems  Constitutional: Negative.   HENT: Negative.   Eyes: Negative.   Respiratory: Negative.   Cardiovascular: Negative.   Gastrointestinal: Negative.   Genitourinary: Negative.   Musculoskeletal: Negative.   Skin: Negative.   Neurological: Negative.   Endo/Heme/Allergies: Negative.   Psychiatric/Behavioral: Positive for depression.  All other systems reviewed and are negative.   Blood pressure 123/77, pulse 77, weight 172  lb 9.6 oz (78.3 kg).Body mass index is 25.49 kg/m.  General Appearance: Casual with strong body odor   Eye Contact:  Good  Speech:  Normal Rate  Volume:  Normal   Mood: Reports has been stable over past couple weeks   Affect:  Pleasant   Thought Process:  Linear and Logical  Orientation:  Full (Time, Place, and Person)  Thought Content:  Negative  Suicidal Thoughts:  No  Homicidal Thoughts:  No  Memory:  Immediate;   Good Recent;   Good Remote;   Good  Judgement:  Good  Insight:  Good  Psychomotor Activity:  Negative  Concentration:  Good  Recall:  Good  Fund of Knowledge: Good  Language: Good  Akathisia:  Negative  Handed:  Right unknown   AIMS (if indicated):   Done 02/09/2017 normal  Assets:  Communication Skills Desire for Improvement Social Support  ADL's:  Intact  Cognition: WNL  Sleep:  Good    Is the patient at risk to self?  No. Has the patient been a risk to self in the past 6 months?  No. Has the patient been a risk to self within the distant past?  No. Is the patient a risk to others?  No. Has the patient been a risk to others in the past 6 months?  No. Has the patient been a risk to others within the distant past?  No.  Current Medications: Current Outpatient Prescriptions  Medication Sig Dispense Refill  . ARIPiprazole (ABILIFY) 10 MG tablet Take 1 tablet (10 mg total) by mouth daily. Mood control 30 tablet 1  . citalopram (CELEXA) 20 MG tablet Take 1 tablet (20 mg total) by mouth daily. For depression 30 tablet 1  . lamoTRIgine (LAMICTAL) 150 MG tablet Take 1 tablet (150 mg total) by mouth daily. 30 tablet 2  . sucralfate (CARAFATE) 1 g tablet Take 1 tablet (1 g total) by mouth 4 (four) times daily -  with meals and at bedtime. 28 tablet 0  . traZODone (DESYREL) 50 MG tablet Take 1 tablet (50 mg) at bedtime: For sleep 30 tablet 2   No current facility-administered medications for this visit.     Medical Decision Making:  Established Problem, Stable/Improving (1), Review of Medication Regimen & Side Effects (2) and Review of New Medication or Change in Dosage (2)  Treatment Plan Summary:Medication management and Plan   Bipolar disorder type II, most recent episode depressed   Decrease Celexa to 10 mg once daily for a week and then discontinue. We discussed that the his doctor in [place] may have a different plan but to discuss with his psychiatrist at his program in [place] about this plan.  We discussed that it could also be adding to his irritability and patient feels it has not been helpful. Goal is to taper off completely. Continue Lamictal at 150 mg daily. Continue Abilify to 10 mg daily Continue trazodone at 50 mg at  bedtime as needed daily  Patient will be seen after his 3 month program [facility] [place] [facility]   .[place]RAVI, [facility] 02/22/2017, 12:10 PM

## 2017-05-31 ENCOUNTER — Ambulatory Visit: Payer: Self-pay | Admitting: Psychiatry

## 2017-06-21 ENCOUNTER — Encounter: Payer: Self-pay | Admitting: Psychiatry

## 2017-06-21 ENCOUNTER — Ambulatory Visit (INDEPENDENT_AMBULATORY_CARE_PROVIDER_SITE_OTHER): Payer: 59 | Admitting: Psychiatry

## 2017-06-21 VITALS — BP 131/75 | HR 89 | Temp 97.3°F | Wt 160.4 lb

## 2017-06-21 DIAGNOSIS — F3130 Bipolar disorder, current episode depressed, mild or moderate severity, unspecified: Secondary | ICD-10-CM | POA: Diagnosis not present

## 2017-06-21 DIAGNOSIS — F1910 Other psychoactive substance abuse, uncomplicated: Secondary | ICD-10-CM | POA: Diagnosis not present

## 2017-06-21 MED ORDER — LAMOTRIGINE 150 MG PO TABS: 150.0000 mg | ORAL_TABLET | Freq: Every day | ORAL | 1 refills | Status: DC

## 2017-06-21 MED ORDER — LURASIDONE HCL 40 MG PO TABS: 40.0000 mg | ORAL_TABLET | Freq: Every day | ORAL | 1 refills | Status: DC

## 2017-06-21 MED ORDER — TRAZODONE HCL 50 MG PO TABS: ORAL_TABLET | ORAL | 2 refills | Status: DC

## 2017-06-21 NOTE — Progress Notes (Signed)
Patient ID: Jerry Mccarthy, male   DOB: 1990/12/29, 26 y.o.   MRN: [id number] [facility]/NP OP Progress Note  06/21/2017 1:07 PM Jerry Columbiayler M Bartel  MRN:  [id number]  Subjective:  Patient returns follow-up as bipolar disorder I for follow-up appointment. Patient reports that he did well in the program in [place] and got back on June 8. Patient reports that during his time in the program he learned a lot about the triggers that led to him abusing alcohol and other substances. States that he had a lot of losses including some relationship issues which he was able to work on during his time in the program. States that though he does have some bad days he has overall been doing well. Sleeping well and eating well. Denies any depression for most days. He is seeing a counselor a biweekly basis. His medication was changed to Latuda from Abilify and his doing quite well on it.  Denies any manic symptoms or suicidal thoughts. He is living with his parents currently and it is helping him to have the structure that he needs at this time. Has 2 job offerings and he is trying to see which would work better for him.  PHQ 9 of 4  Chief Complaint: doing well  Chief Complaint    Follow-up; Medication Refill     Visit Diagnosis:     ICD-10-CM   1. Bipolar I disorder, most recent episode depressed (HCC) F31.30   2. Polysubstance abuse F19.10     Past Medical History:  Past Medical History:  Diagnosis Date  . ADHD (attention deficit hyperactivity disorder)   . Anxiety and depression   . Bipolar 2 disorder (HCC)    History reviewed. No pertinent surgical history. Family History:  Family History  Problem Relation Age of Onset  . Depression Mother   . Cancer Maternal Grandmother   . Bipolar disorder Maternal Grandmother   . Lung cancer Paternal Grandmother   . Hyperlipidemia Father    Social History:  Social History   Social History  . Marital status: Single    Spouse name: N/A  . Number of  children: 0  . Years of education: College   Occupational History  .  Other    [employer]   Social History Main Topics  . Smoking status: Current Every Day Smoker    Packs/day: 0.25    Types: Cigarettes    Start date: 12/25/2014  . Smokeless tobacco: Former NeurosurgeonUser    Types: Chew  . Alcohol use No     Comment: a fifth 3-4 times a week  . Drug use: Yes    Types: Marijuana     Comment: last used on 12-18-15  . Sexual activity: Yes    Birth control/ protection: Condom   Other Topics Concern  . None   Social History Narrative   [facility] [employer] [facility] [employer].   Caffeine Use: 2 cups daily   Additional History:   Assessment:   Musculoskeletal: Strength & Muscle Tone: within normal limits Gait & Station: normal Patient leans: N/A  Psychiatric Specialty Exam: Medication Refill   Anxiety     Depression         Past medical history includes anxiety.     Review of Systems  Constitutional: Negative.   HENT: Negative.   Eyes: Negative.   Respiratory: Negative.   Cardiovascular: Negative.   Gastrointestinal: Negative.   Genitourinary: Negative.   Musculoskeletal: Negative.   Skin: Negative.   Neurological:  Negative.   Endo/Heme/Allergies: Negative.   Psychiatric/Behavioral: Negative for depression.  All other systems reviewed and are negative.   Blood pressure 131/75, pulse 89, temperature (!) 97.3 F (36.3 C), temperature source Oral, weight 160 lb 6.4 oz (72.8 kg).Body mass index is 23.69 kg/m.  General Appearance: Casual   Eye Contact:  Good  Speech:  Normal Rate  Volume:  Normal   Mood: doing better  Affect:  Pleasant   Thought Process:  Linear and Logical  Orientation:  Full (Time, Place, and Person)  Thought Content:  Negative  Suicidal Thoughts:  No  Homicidal Thoughts:  No  Memory:  Immediate;   Good Recent;   Good Remote;   Good  Judgement:  Good  Insight:  Good  Psychomotor Activity:  Negative  Concentration:  Good  Recall:   Good  Fund of Knowledge: Good  Language: Good  Akathisia:  Negative  Handed:  Right unknown   AIMS (if indicated):  Done 02/09/2017 normal  Assets:  Communication Skills Desire for Improvement Social Support  ADL's:  Intact  Cognition: WNL  Sleep:  Good    Is the patient at risk to self?  No. Has the patient been a risk to self in the past 6 months?  No. Has the patient been a risk to self within the distant past?  No. Is the patient a risk to others?  No. Has the patient been a risk to others in the past 6 months?  No. Has the patient been a risk to others within the distant past?  No.  Current Medications: Current Outpatient Prescriptions  Medication Sig Dispense Refill  . citalopram (CELEXA) 20 MG tablet Take 1 tablet (20 mg total) by mouth daily. For depression 90 tablet 1  . lamoTRIgine (LAMICTAL) 150 MG tablet Take 1 tablet (150 mg total) by mouth daily. 90 tablet 1  . lurasidone (LATUDA) 40 MG TABS tablet Take 40 mg by mouth daily with breakfast.    . sucralfate (CARAFATE) 1 g tablet Take 1 tablet (1 g total) by mouth 4 (four) times daily -  with meals and at bedtime. 28 tablet 0  . traZODone (DESYREL) 50 MG tablet Take 1 tablet (50 mg) at bedtime: For sleep 30 tablet 2   No current facility-administered medications for this visit.     Medical Decision Making:  Established Problem, Stable/Improving (1), Review of Medication Regimen & Side Effects (2) and Review of New Medication or Change in Dosage (2)  Treatment Plan Summary:Medication management and Plan   Bipolar disorder type II, most recent episode depressed   Continue Lamictal at 150 mg daily. Continue Latuda at 40mg  po qd. Continue trazodone at 50 mg at bedtime as needed daily  Continue therapy with Lamar Benes who is  A Electrical engineer,   Return to clinic in 1 month's time or call before if needed     .Marrion Finan 06/21/2017, 1:07 PM

## 2017-07-21 ENCOUNTER — Ambulatory Visit: Payer: 59 | Admitting: Psychiatry

## 2017-08-17 ENCOUNTER — Encounter: Payer: Self-pay | Admitting: Psychiatry

## 2017-08-17 ENCOUNTER — Ambulatory Visit (INDEPENDENT_AMBULATORY_CARE_PROVIDER_SITE_OTHER): Payer: 59 | Admitting: Psychiatry

## 2017-08-17 VITALS — BP 138/80 | HR 98 | Temp 97.7°F | Wt 156.0 lb

## 2017-08-17 DIAGNOSIS — F1910 Other psychoactive substance abuse, uncomplicated: Secondary | ICD-10-CM | POA: Diagnosis not present

## 2017-08-17 DIAGNOSIS — F3130 Bipolar disorder, current episode depressed, mild or moderate severity, unspecified: Secondary | ICD-10-CM

## 2017-08-17 MED ORDER — LAMOTRIGINE 150 MG PO TABS: 150.0000 mg | ORAL_TABLET | Freq: Every day | ORAL | 1 refills | Status: DC

## 2017-08-17 MED ORDER — LURASIDONE HCL 40 MG PO TABS: 40.0000 mg | ORAL_TABLET | Freq: Every day | ORAL | 1 refills | Status: DC

## 2017-08-17 MED ORDER — TRAZODONE HCL 50 MG PO TABS: ORAL_TABLET | ORAL | 2 refills | Status: DC

## 2017-08-17 NOTE — Progress Notes (Signed)
Patient ID: Jerry Mccarthy, male   DOB: 1991/04/09, 26 y.o.   MRN: [id number] [facility] OP Progress Note  08/17/2017 8:59 AM Jerry Mccarthy  MRN:  [id number]  Subjective:  Patient returns follow-up as bipolar disorder I for follow-up appointment. Patient today reports that after his previous appointment in July he did have a relapse on the alcohol and got very depressed. He does admit that he was not taking his medications properly. States that he manipulated his parents into spending the money that he got for gas for drinking and for cigarettes. States that over the last week he started taking Vyvanse given by a friend at 70 mg daily. States that this medication has helped him tremendously with his mood, energy and focus. States that he started a job at [employer] and the would like to continue to keep the job. States that if he continues to take the Vyvanse he is quite sure that he continue to do well. We discussed that the Vyvanse is not indicated at this time and its probable that he could be using it to replace other drugs. However patient was insistent that this is the only medication that works well for him. We discussed that in that case he may have to start seeing a different physician. Patient then stated that this clinician was quite helpful to him but he feels like at this time he may have to see somebody else. Drinking or doing any drugs at this time. States that his last drink was 2 weeks ago. Denied any suicidal thoughts.     Chief Complaint: doing ok  Chief Complaint    Follow-up; Medication Refill     Visit Diagnosis:     ICD-10-CM   1. Bipolar I disorder, most recent episode depressed (HCC) F31.30   2. Polysubstance abuse F19.10     Past Medical History:  Past Medical History:  Diagnosis Date  . ADHD (attention deficit hyperactivity disorder)   . Anxiety and depression   . Bipolar 2 disorder (HCC)    History reviewed. No pertinent surgical history. Family  History:  Family History  Problem Relation Age of Onset  . Depression Mother   . Cancer Maternal Grandmother   . Bipolar disorder Maternal Grandmother   . Lung cancer Paternal Grandmother   . Hyperlipidemia Father    Social History:  Social History   Social History  . Marital status: Single    Spouse name: N/A  . Number of children: 0  . Years of education: College   Occupational History  .  Other    [employer]   Social History Main Topics  . Smoking status: Current Every Day Smoker    Packs/day: 0.25    Types: Cigarettes    Start date: 12/25/2014  . Smokeless tobacco: Former NeurosurgeonUser    Types: Chew  . Alcohol use No     Comment: a fifth 3-4 times a week  . Drug use: Yes    Types: Marijuana     Comment: last used on 12-18-15  . Sexual activity: Yes    Birth control/ protection: Condom   Other Topics Concern  . None   Social History Narrative   [facility] [employer].   Caffeine Use: 2 cups daily   Additional History:   Assessment:   Musculoskeletal: Strength & Muscle Tone: within normal limits Gait & Station: normal Patient leans: N/A  Psychiatric Specialty Exam: Medication Refill   Anxiety  Depression         Past medical history includes anxiety.     Review of Systems  Constitutional: Negative.   HENT: Negative.   Eyes: Negative.   Respiratory: Negative.   Cardiovascular: Negative.   Gastrointestinal: Negative.   Genitourinary: Negative.   Musculoskeletal: Negative.   Skin: Negative.   Neurological: Negative.   Endo/Heme/Allergies: Negative.   Psychiatric/Behavioral: Negative for depression.  All other systems reviewed and are negative.   Blood pressure 138/80, pulse 98, temperature 97.7 F (36.5 C), temperature source Oral, weight 156 lb (70.8 kg).Body mass index is 23.04 kg/m.  General Appearance: Casual   Eye Contact:  Good  Speech:  Normal Rate  Volume:  Normal   Mood: ok  Affect:  Pleasant   Thought  Process:  Linear and Logical  Orientation:  Full (Time, Place, and Person)  Thought Content:  Negative  Suicidal Thoughts:  No  Homicidal Thoughts:  No  Memory:  Immediate;   Good Recent;   Good Remote;   Good  Judgement:  Good  Insight:  Good  Psychomotor Activity:  Negative  Concentration:  Good  Recall:  Good  Fund of Knowledge: Good  Language: Good  Akathisia:  Negative  Handed:  Right unknown   AIMS (if indicated):  Done 02/09/2017 normal  Assets:  Communication Skills Desire for Improvement Social Support  ADL's:  Intact  Cognition: WNL  Sleep:  Good    Is the patient at risk to self?  No. Has the patient been a risk to self in the past 6 months?  No. Has the patient been a risk to self within the distant past?  No. Is the patient a risk to others?  No. Has the patient been a risk to others in the past 6 months?  No. Has the patient been a risk to others within the distant past?  No.  Current Medications: Current Outpatient Prescriptions  Medication Sig Dispense Refill  . lamoTRIgine (LAMICTAL) 150 MG tablet Take 1 tablet (150 mg total) by mouth daily. 30 tablet 1  . lurasidone (LATUDA) 40 MG TABS tablet Take 1 tablet (40 mg total) by mouth daily with breakfast. 30 tablet 1  . sucralfate (CARAFATE) 1 g tablet Take 1 tablet (1 g total) by mouth 4 (four) times daily -  with meals and at bedtime. 28 tablet 0  . traZODone (DESYREL) 50 MG tablet Take 1 tablet (50 mg) at bedtime: For sleep 30 tablet 2   No current facility-administered medications for this visit.     Medical Decision Making:  Established Problem, Stable/Improving (1), Review of Medication Regimen & Side Effects (2) and Review of New Medication or Change in Dosage (2)  Treatment Plan Summary:Medication management and Plan   Bipolar disorder type II, most recent episode depressed   Continue Lamictal at 150 mg daily. Continue Latuda at 40mg  po qd. Continue trazodone at 50 mg at bedtime as needed  daily  Patient will follow-up with another psychiatrist per his choice since he would like to take Vyvanse and that will not be prescribed here at this clinic without an assessment for ADHD, and stabilization off his substance abuse. Patient was given 2 months worth of his medications and he was also given a referral to [facility] to start intensive outpatient services for substance abuse in the future.     Patrick North 08/17/2017, 8:59 AM

## 2017-09-01 ENCOUNTER — Ambulatory Visit: Payer: Self-pay | Admitting: Family Medicine

## 2017-09-06 ENCOUNTER — Encounter: Payer: Self-pay | Admitting: Family Medicine

## 2017-09-06 ENCOUNTER — Ambulatory Visit (INDEPENDENT_AMBULATORY_CARE_PROVIDER_SITE_OTHER): Payer: 59 | Admitting: Family Medicine

## 2017-09-06 VITALS — BP 106/62 | HR 68 | Temp 97.9°F | Resp 14 | Wt 162.0 lb

## 2017-09-06 DIAGNOSIS — F988 Other specified behavioral and emotional disorders with onset usually occurring in childhood and adolescence: Secondary | ICD-10-CM | POA: Diagnosis not present

## 2017-09-06 DIAGNOSIS — G4700 Insomnia, unspecified: Secondary | ICD-10-CM | POA: Diagnosis not present

## 2017-09-06 DIAGNOSIS — F314 Bipolar disorder, current episode depressed, severe, without psychotic features: Secondary | ICD-10-CM | POA: Diagnosis not present

## 2017-09-06 MED ORDER — AMPHETAMINE-DEXTROAMPHETAMINE 10 MG PO TABS: 10.0000 mg | ORAL_TABLET | Freq: Two times a day (BID) | ORAL | 0 refills | Status: DC

## 2017-09-06 MED ORDER — TRAZODONE HCL 100 MG PO TABS: 100.0000 mg | ORAL_TABLET | Freq: Every day | ORAL | 11 refills | Status: AC

## 2017-09-06 NOTE — Progress Notes (Signed)
Patient: Jerry Mccarthy Male    DOB: 02-Oct-1991   26 y.o.   MRN: [id number] Visit Date: 09/06/2017  Today's Provider: Megan Mans, MD   Chief Complaint  Patient presents with  . Depression  . Manic Behavior   Subjective:    HPI Pt is here today to discuss his depression and bipolar. He reports that he was seeing psychiatry for this but he got frustrated with having to try different medications all the time etc. He says that he is not able to concentrated well, he is tired all the time despite sleeping 7 hours a night and he just does not feel that he is doing to his fullest potential at work. He is open to seeing another psychiatrist.   Depression screen [facility] 2/9 09/06/2017  Decreased Interest 2  Down, Depressed, Hopeless 1  PHQ - 2 Score 3  Altered sleeping 3  Tired, decreased energy 3  Change in appetite 0  Feeling bad or failure about yourself  2  Trouble concentrating 3  Moving slowly or fidgety/restless 0  Suicidal thoughts 0  PHQ-9 Score 14  Difficult doing work/chores Extremely dIfficult       Allergies  Allergen Reactions  . Amoxicillin-Pot Clavulanate Nausea And Vomiting     Current Outpatient Prescriptions:  .  lamoTRIgine (LAMICTAL) 150 MG tablet, Take 1 tablet (150 mg total) by mouth daily., Disp: 30 tablet, Rfl: 1 .  lurasidone (LATUDA) 40 MG TABS tablet, Take 1 tablet (40 mg total) by mouth daily with breakfast., Disp: 30 tablet, Rfl: 1 .  traZODone (DESYREL) 50 MG tablet, Take 1 tablet (50 mg) at bedtime: For sleep, Disp: 30 tablet, Rfl: 2  Review of Systems  Constitutional: Positive for fatigue.  HENT: Negative.   Eyes: Negative.   Respiratory: Negative.   Cardiovascular: Negative.   Gastrointestinal: Negative.   Endocrine: Negative.   Genitourinary: Negative.   Musculoskeletal: Negative.   Skin: Negative.   Allergic/Immunologic: Negative.   Neurological: Negative.   Psychiatric/Behavioral: Positive for agitation, decreased  concentration and dysphoric mood.    Social History  Substance Use Topics  . Smoking status: Current Every Day Smoker    Packs/day: 1.00    Types: Cigarettes    Start date: 12/25/2014  . Smokeless tobacco: Former Neurosurgeon    Types: Chew  . Alcohol use No     Comment: 3-4 beers once every 2 weeks   Objective:   BP 106/62 (BP Location: Left Arm, Patient Position: Sitting, Cuff Size: Normal)   Pulse 68   Temp 97.9 F (36.6 C) (Oral)   Resp 14   Wt 162 lb (73.5 kg)   BMI 23.92 kg/m  Vitals:   09/06/17 1627  BP: 106/62  Pulse: 68  Resp: 14  Temp: 97.9 F (36.6 C)  TempSrc: Oral  Weight: 162 lb (73.5 kg)     Physical Exam  Constitutional: He is oriented to person, place, and time. He appears well-developed and well-nourished.  Eyes: Pupils are equal, round, and reactive to light. Conjunctivae and EOM are normal.  Neck: Normal range of motion. Neck supple.  Cardiovascular: Normal rate, regular rhythm, normal heart sounds and intact distal pulses.   Pulmonary/Chest: Effort normal and breath sounds normal.  Musculoskeletal: Normal range of motion.  Neurological: He is alert and oriented to person, place, and time. He has normal reflexes.  Skin: Skin is warm and dry.  Psychiatric: He has a normal mood and affect. His behavior is normal.  Judgment and thought content normal.        Assessment & Plan:     1. Bipolar disorder, current episode depressed, severe, without psychotic features (HCC)-  -Ambulatory referral to Psychiatry  2. Insomnia, unspecified type Increase trazodone.  - traZODone (DESYREL) 100 MG tablet; Take 1 tablet (100 mg total) by mouth at bedtime. Take 1 tablet at bedtime: For sleep  Dispense: 30 tablet; Refill: 11 - Ambulatory referral to Psychiatry  3. Attention deficit disorder, unspecified hyperactivity presence Start adderall low dose.  - amphetamine-dextroamphetamine (ADDERALL) 10 MG tablet; Take 1 tablet (10 mg total) by mouth 2 (two) times daily.   Dispense: 60 tablet; Refill: 0 RTC 1 month.     HPI, Exam, and A&P Transcribed under the direction and in the presence Wendelyn Bresloward L.  Jr, MD  Electronically Signed: Silvio Pate, CMA   I have done the exam and reviewed the above chart and it is accurate to the best of my knowledge. Dentist has been used in this note in any air is in the dictation or transcription are unintentional.  Megan Mans, MD  [facility]

## 2017-10-06 ENCOUNTER — Ambulatory Visit (INDEPENDENT_AMBULATORY_CARE_PROVIDER_SITE_OTHER): Payer: 59 | Admitting: Family Medicine

## 2017-10-06 VITALS — BP 130/78 | HR 114 | Temp 98.3°F | Resp 16 | Wt 163.0 lb

## 2017-10-06 DIAGNOSIS — F314 Bipolar disorder, current episode depressed, severe, without psychotic features: Secondary | ICD-10-CM

## 2017-10-06 DIAGNOSIS — F988 Other specified behavioral and emotional disorders with onset usually occurring in childhood and adolescence: Secondary | ICD-10-CM

## 2017-10-06 MED ORDER — AMPHETAMINE-DEXTROAMPHETAMINE 20 MG PO TABS: 20.0000 mg | ORAL_TABLET | Freq: Two times a day (BID) | ORAL | 0 refills | Status: DC

## 2017-10-06 MED ORDER — AMPHETAMINE-DEXTROAMPHET ER 20 MG PO CP24: 20.0000 mg | ORAL_CAPSULE | ORAL | 0 refills | Status: AC

## 2017-10-06 NOTE — Progress Notes (Signed)
Jerry Mccarthy  MRN: [id number] DOB: 04-04-1991  Subjective:  HPI   The patient is a 26 year old male who presents for follow up after he was started on Adderall for ADD and having increased his Trazodone for sleep to 100 mg.  The patient was last seen on 09/06/17.  He reports that the Adderall has been "amazing".  He states he is more focused, has increased energy, confidence and decreased racing thoughts. The patientt state that the first week to 10 days was great and then the last two weeks have been a little less effective.  He thinks that the dose needs to be just a little more.  The patient was also seen for his insomnia.  He was given an increased dose of Trazodone and reports that this is working very well and he does not think the dose needs any changing   Patient Active Problem List   Diagnosis Date Noted  . Alcohol abuse 03/19/2016  . Bipolar affective disorder, depressed, severe (HCC) 03/19/2016  . Affective psychosis, bipolar (HCC)   . Polysubstance abuse (HCC)   . ADD (attention deficit disorder) 06/09/2015  . Anxiety 06/09/2015  . Bipolar affect, depressed (HCC) 06/09/2015  . Clinical depression 06/09/2015  . Headache, migraine 06/09/2015  . Cannot sleep 06/09/2015  . Drug-related disorder (HCC) 06/09/2015  . Seizure (HCC) 06/09/2015    Past Medical History:  Diagnosis Date  . ADHD (attention deficit hyperactivity disorder)   . Anxiety and depression   . Bipolar 2 disorder [facility](HCC)     Social History   Social History  . Marital status: Single    Spouse name: N/A  . Number of children: 0  . Years of education: College   Occupational History  .  Other    [employer]   Social History Main Topics  . Smoking status: Current Every Day Smoker    Packs/day: 1.00    Types: Cigarettes    Start date: 12/25/2014  . Smokeless tobacco: Former NeurosurgeonUser    Types: Chew  . Alcohol use No     Comment: 3-4 beers once every 2 weeks  . Drug use: No     Comment: last used  on 12-18-15  . Sexual activity: Yes    Birth control/ protection: Condom   Other Topics Concern  . Not on file   Social History Narrative   [facility] [employer].   Caffeine Use: 2 cups daily    Outpatient Encounter Prescriptions as of 10/06/2017  Medication Sig  . amphetamine-dextroamphetamine (ADDERALL) 10 MG tablet Take 1 tablet (10 mg total) by mouth 2 (two) times daily.  Marland Kitchen. lamoTRIgine (LAMICTAL) 150 MG tablet Take 1 tablet (150 mg total) by mouth daily.  Marland Kitchen. lurasidone (LATUDA) 40 MG TABS tablet Take 1 tablet (40 mg total) by mouth daily with breakfast.  . traZODone (DESYREL) 100 MG tablet Take 1 tablet (100 mg total) by mouth at bedtime. Take 1 tablet at bedtime: For sleep   No facility-administered encounter medications on file as of 10/06/2017.     Allergies  Allergen Reactions  . Amoxicillin-Pot Clavulanate Nausea And Vomiting    Review of Systems  Constitutional: Negative for fever and malaise/fatigue.  Respiratory: Negative for cough, shortness of breath and wheezing.   Cardiovascular: Negative for chest pain and palpitations.  Neurological: Negative for weakness.  Psychiatric/Behavioral: Positive for depression (Few down days but not as bad as the past). Negative for hallucinations, memory loss, substance abuse and suicidal  ideas. The patient is nervous/anxious (minor, much better than before). The patient does not have insomnia.     Objective:  BP 130/78 (BP Location: Right Arm, Patient Position: Sitting, Cuff Size: Normal)   Pulse (!) 114   Temp 98.3 F (36.8 C) (Oral)   Resp 16   Wt 163 lb (73.9 kg)   BMI 24.07 kg/m   Physical Exam  Constitutional: He is oriented to person, place, and time and well-developed, well-nourished, and in no distress.  HENT:  Head: Normocephalic and atraumatic.  Eyes: Conjunctivae are normal. No scleral icterus.  Neck: No thyromegaly present.  Cardiovascular: Normal rate, regular rhythm and normal heart  sounds.   Pulmonary/Chest: Effort normal and breath sounds normal.  Abdominal: Soft.  Neurological: He is alert and oriented to person, place, and time.  Skin: Skin is warm and dry.  Psychiatric: Mood, memory, affect and judgment normal.    Assessment and Plan :  Bipolar Disorder ADHD Refill Adderall. F/u per psychiatry. CPE 6 months.  I have done the exam and reviewed the chart and it is accurate to the best of my knowledge. Dentist has been used and  any errors in dictation or transcription are unintentional. Julieanne Manson M.D. [facility]

## 2017-10-07 ENCOUNTER — Telehealth: Payer: Self-pay | Admitting: Family Medicine

## 2017-10-07 NOTE — Telephone Encounter (Signed)
Lmtcb, looks per note hat Adderall XR and Adderall regular was printed. Need to find out if this was what was discussed to try 2 different ones if not this was a mistake and we will need to reprint the right one for the patient.-Cathie Bonnell V Kao Berkheimer, RMA

## 2017-10-07 NOTE — Telephone Encounter (Signed)
Pt returned call. Thanks TNP °

## 2017-10-07 NOTE — Telephone Encounter (Addendum)
Pt states he was in yesterday and states he rec'd 2 different Rx for the Rx ADDERALL.  Pt states the Rx look like 2 are totally different medications.  Pt is asking for a call back to advise.  ZO#[phone]/WJCB#[phone]/MW

## 2017-10-07 NOTE — Telephone Encounter (Signed)
Spoke with patient. Patient is suppose to be on Adderall regular twice daily not XR. Patient needs 1 RX re printed for the correct Adderall. Ok to do? -Consuella LoseAnastasiya V Errol Ala, RMA

## 2017-10-12 ENCOUNTER — Ambulatory Visit: Payer: Self-pay | Admitting: Family Medicine

## 2017-10-12 ENCOUNTER — Other Ambulatory Visit: Payer: Self-pay

## 2017-10-12 DIAGNOSIS — F988 Other specified behavioral and emotional disorders with onset usually occurring in childhood and adolescence: Secondary | ICD-10-CM

## 2017-10-12 MED ORDER — AMPHETAMINE-DEXTROAMPHETAMINE 20 MG PO TABS: 20.0000 mg | ORAL_TABLET | Freq: Two times a day (BID) | ORAL | 0 refills | Status: DC

## 2017-10-12 NOTE — Telephone Encounter (Signed)
Note reads regular 10mg  twice a day. Have him bring in the aches are prescription to replace

## 2017-10-13 NOTE — Telephone Encounter (Signed)
Jerry Mccarthy spoke with Dr Sullivan LoneGilbert on 10/12/17 and per Jerry Mccarthy who spoke with the patient. He filled the regular Adderall and the XR RX was also given to the pharmacy because both were on the same sheet. Per patient he does not need another RX at this time. Patient will most likely call in November for another Jerry Mccarthy-Jerry Mccarthy, RMA

## 2017-10-16 ENCOUNTER — Other Ambulatory Visit: Payer: Self-pay | Admitting: Psychiatry

## 2017-10-26 ENCOUNTER — Telehealth: Payer: Self-pay | Admitting: Family Medicine

## 2017-10-26 MED ORDER — LURASIDONE HCL 40 MG PO TABS: 40.0000 mg | ORAL_TABLET | Freq: Every day | ORAL | 1 refills | Status: DC

## 2017-10-26 NOTE — Telephone Encounter (Signed)
Patient advised as below. Patient states he does not see the psychiatrist he was seen and getting medication from but he does have an appointment with Dr Evelene CroonKaur in January. He may need 1 more refill before that appointment and patient was advised to let us know if he does but after that psychiatrist would need to fill it-Anastasiya V Hopkins, RMA

## 2017-10-26 NOTE — Telephone Encounter (Signed)
Pt contacted office for refill request on the following medications:  lurasidone (LATUDA) 40 MG TABS tablet.  Pt is requesting refills.  [facility] [place]  [phone]#[phone]/MW

## 2017-10-26 NOTE — Telephone Encounter (Signed)
1 month,1rf--this should come from psychiatrist in future.

## 2017-11-10 ENCOUNTER — Other Ambulatory Visit: Payer: Self-pay

## 2017-11-10 ENCOUNTER — Telehealth: Payer: Self-pay | Admitting: Family Medicine

## 2017-11-10 MED ORDER — LAMOTRIGINE 150 MG PO TABS: 150.0000 mg | ORAL_TABLET | Freq: Every day | ORAL | 1 refills | Status: DC

## 2017-11-10 NOTE — Telephone Encounter (Signed)
Pt needs refill of lamotrigine 150 MG  [facility] [place] [facility] [address]  Pt only has two pills left.

## 2017-11-10 NOTE — Telephone Encounter (Signed)
Rx sent for 1 more month.  Pt could not get in to see Dr. Evelene CroonKaur until January.

## 2017-11-14 ENCOUNTER — Other Ambulatory Visit: Payer: Self-pay | Admitting: Family Medicine

## 2017-11-14 DIAGNOSIS — F988 Other specified behavioral and emotional disorders with onset usually occurring in childhood and adolescence: Secondary | ICD-10-CM

## 2017-11-14 NOTE — Telephone Encounter (Signed)
Pt contacted office for refill request on the following medications:  amphetamine-dextroamphetamine (ADDERALL) 20 MG tablet   [phone]#[phone]/MW

## 2017-11-15 MED ORDER — AMPHETAMINE-DEXTROAMPHETAMINE 20 MG PO TABS: 20.0000 mg | ORAL_TABLET | Freq: Two times a day (BID) | ORAL | 0 refills | Status: DC

## 2017-12-14 ENCOUNTER — Telehealth: Payer: Self-pay

## 2017-12-14 DIAGNOSIS — F988 Other specified behavioral and emotional disorders with onset usually occurring in childhood and adolescence: Secondary | ICD-10-CM

## 2017-12-14 NOTE — Telephone Encounter (Signed)
Is this ok to fill?-Jerry Mccarthy V Aune Adami, RMA

## 2017-12-14 NOTE — Telephone Encounter (Signed)
Patient is requesting a refill on Adderall 20 mg BID. Patient states he had a appointment with Dr. Evelene CroonKaur on 12/22/16 and had to reschedule. The next available appointment was not until 03/14/17. He states he will be out of medication Friday. CB# [phone]

## 2017-12-15 MED ORDER — AMPHETAMINE-DEXTROAMPHETAMINE 20 MG PO TABS: 20.0000 mg | ORAL_TABLET | Freq: Two times a day (BID) | ORAL | 0 refills | Status: DC

## 2017-12-15 NOTE — Telephone Encounter (Signed)
Ok to rf? 

## 2017-12-15 NOTE — Telephone Encounter (Signed)
Refilled x 2, need appointment before any more refills unless patient sees psychiatrist before that, patient understood-Jerry Mccarthy, RMA

## 2017-12-26 ENCOUNTER — Other Ambulatory Visit: Payer: Self-pay | Admitting: Family Medicine

## 2018-01-16 ENCOUNTER — Telehealth: Payer: Self-pay

## 2018-01-16 NOTE — Telephone Encounter (Signed)
Paperwork sent in for provider to sign.  Provider was given the papers.

## 2018-01-17 ENCOUNTER — Telehealth: Payer: Self-pay | Admitting: Family Medicine

## 2018-01-17 NOTE — Telephone Encounter (Signed)
Pt called for a status update on the forms he dropped off yesterday 01/16/18. Pt stated that he can't return to work until the form is completed and returned. Pt stated he needed to turn in the form by tomorrow 01/18/18 @ 5 pm. Please advise. Thanks TNP

## 2018-01-18 NOTE — Telephone Encounter (Signed)
Papers signed and put at front desk to be picked up.

## 2018-02-22 ENCOUNTER — Ambulatory Visit (INDEPENDENT_AMBULATORY_CARE_PROVIDER_SITE_OTHER): Payer: 59 | Admitting: Family Medicine

## 2018-02-22 ENCOUNTER — Other Ambulatory Visit: Payer: Self-pay

## 2018-02-22 ENCOUNTER — Encounter: Payer: Self-pay | Admitting: Family Medicine

## 2018-02-22 VITALS — BP 118/60 | HR 94 | Temp 98.1°F | Resp 16

## 2018-02-22 DIAGNOSIS — E785 Hyperlipidemia, unspecified: Secondary | ICD-10-CM

## 2018-02-22 DIAGNOSIS — F314 Bipolar disorder, current episode depressed, severe, without psychotic features: Secondary | ICD-10-CM | POA: Diagnosis not present

## 2018-02-22 DIAGNOSIS — F988 Other specified behavioral and emotional disorders with onset usually occurring in childhood and adolescence: Secondary | ICD-10-CM

## 2018-02-22 DIAGNOSIS — Z79899 Other long term (current) drug therapy: Secondary | ICD-10-CM

## 2018-02-22 MED ORDER — AMPHETAMINE-DEXTROAMPHETAMINE 20 MG PO TABS: 20.0000 mg | ORAL_TABLET | Freq: Two times a day (BID) | ORAL | 0 refills | Status: AC

## 2018-02-22 NOTE — Progress Notes (Signed)
Jerry Mccarthy  MRN: [id number] DOB: [birth year]  Subjective:  HPI   The patient is a 27 year old male that presents for follow up of his ADD.  He was last seen on 10/06/17.  He states he is doing very well today.  He has an appointment with his new psychiatrist on March 18, 2018.  He states his anxiety is improving also.  He still has some episodes of increased anxiety but not as often.  He states when he has an episode it is usually not out of the blue but when a situation arises, such as hearing conflict between his sisters.  Sometimes when his sister's boyfriend is around he gets a little anxious because of negative feelings toward the boy.  Patient Active Problem List   Diagnosis Date Noted  . Alcohol abuse 03/19/2016  . Bipolar affective disorder, depressed, severe (HCC) 03/19/2016  . Affective psychosis, bipolar (HCC)   . Polysubstance abuse (HCC)   . ADD (attention deficit disorder) 06/09/2015  . Anxiety 06/09/2015  . Bipolar affect, depressed (HCC) 06/09/2015  . Clinical depression 06/09/2015  . Headache, migraine 06/09/2015  . Cannot sleep 06/09/2015  . Drug-related disorder (HCC) 06/09/2015  . Seizure (HCC) 06/09/2015    Past Medical History:  Diagnosis Date  . ADHD (attention deficit hyperactivity disorder)   . Anxiety and depression   . Bipolar 2 disorder (HCC)     Social History   Socioeconomic History  . Marital status: Single    Spouse name: Not on file  . Number of children: 0  . Years of education: College  . Highest education level: Not on file  Social Needs  . Financial resource strain: Not on file  . Food insecurity - worry: Not on file  . Food insecurity - inability: Not on file  . Transportation needs - medical: Not on file  . Transportation needs - non-medical: Not on file  Occupational History    Employer: OTHER    Comment: [employer]  Tobacco Use  . Smoking status: Current Every Day Smoker    Packs/day: 1.00    Types: Cigarettes   Start date: 12/25/2014  . Smokeless tobacco: Former Neurosurgeon    Types: Chew  Substance and Sexual Activity  . Alcohol use: No    Alcohol/week: 0.0 - 2.4 oz    Comment: 3-4 beers once every 2 weeks  . Drug use: No    Comment: last used on 12-18-15  . Sexual activity: Yes    Birth control/protection: Condom  Other Topics Concern  . Not on file  Social History Narrative   Patient lives on campus at [facility].   Caffeine Use: 2 cups daily    Outpatient Encounter Medications as of 02/22/2018  Medication Sig  . amphetamine-dextroamphetamine (ADDERALL XR) 20 MG 24 hr capsule Take 1 capsule (20 mg total) by mouth every morning.  Marland Kitchen amphetamine-dextroamphetamine (ADDERALL) 20 MG tablet Take 1 tablet (20 mg total) by mouth 2 (two) times daily.  Marland Kitchen lamoTRIgine (LAMICTAL) 150 MG tablet Take 1 tablet (150 mg total) by mouth daily.  Marland Kitchen LATUDA 40 MG TABS tablet TAKE 1 TABLET (40 MG TOTAL) DAILY WITH BREAKFAST BY MOUTH.  . traZODone (DESYREL) 100 MG tablet Take 1 tablet (100 mg total) by mouth at bedtime. Take 1 tablet at bedtime: For sleep   No facility-administered encounter medications on file as of 02/22/2018.     Allergies  Allergen Reactions  . Amoxicillin-Pot Clavulanate Nausea And Vomiting    Review  of Systems  Respiratory: Negative for shortness of breath.   Cardiovascular: Negative for chest pain, palpitations and orthopnea.  Psychiatric/Behavioral: Negative for depression, hallucinations, memory loss, substance abuse and suicidal ideas. The patient is nervous/anxious (very intermittent). The patient does not have insomnia.     Objective:  BP 118/60 (BP Location: Right Arm, Patient Position: Sitting, Cuff Size: Normal)   Pulse 94   Temp 98.1 F (36.7 C) (Oral)   Resp 16   Physical Exam  Constitutional: He is oriented to person, place, and time and well-developed, well-nourished, and in no distress.  HENT:  Head: Normocephalic and atraumatic.  Eyes: Conjunctivae are normal. No  scleral icterus.  Neck: No thyromegaly present.  Cardiovascular: Normal rate, regular rhythm and normal heart sounds.  Pulmonary/Chest: Effort normal.  Abdominal: Soft.  Neurological: He is alert and oriented to person, place, and time.  Skin: Skin is warm and dry.  Psychiatric: Mood, memory, affect and judgment normal.    Assessment and Plan :  1. Attention deficit disorder, unspecified hyperactivity presence  - amphetamine-dextroamphetamine (ADDERALL) 20 MG tablet; Take 1 tablet (20 mg total) by mouth 2 (two) times daily.  Dispense: 60 tablet; Refill: 0 - amphetamine-dextroamphetamine (ADDERALL) 20 MG tablet; Take 1 tablet (20 mg total) by mouth 2 (two) times daily.  Dispense: 60 tablet; Refill: 0  2. Medication management  - CBC with Differential/Platelet - TSH  3. Bipolar disorder, current episode depressed, severe, without psychotic features (HCC) Pt knows he must get appt with psychiatry.  4. Hyperlipidemia, unspecified hyperlipidemia type  - Lipid panel - Comprehensive metabolic panel  I have done the exam and reviewed the chart and it is accurate to the best of my knowledge. [employer]  technology has been used and  any errors in dictation or transcription are unintentional. Julieanne Mansonichard Gilbert M.D. [facility]

## 2018-02-23 LAB — LIPID PANEL
CHOLESTEROL TOTAL: 282 mg/dL — AB (ref 100–199)
Chol/HDL Ratio: 7.2 ratio — ABNORMAL HIGH (ref 0.0–5.0)
HDL: 39 mg/dL — AB (ref >39)
LDL CALC: 183 mg/dL — AB (ref 0–99)
TRIGLYCERIDES: 300 mg/dL — AB (ref 0–149)
VLDL CHOLESTEROL CAL: 60 mg/dL — AB (ref 5–40)

## 2018-02-23 LAB — CBC WITH DIFFERENTIAL/PLATELET
BASOS ABS: 0.1 10*3/uL (ref 0.0–0.2)
Basos: 1 %
EOS (ABSOLUTE): 0.1 10*3/uL (ref 0.0–0.4)
Eos: 1 %
HEMOGLOBIN: 14 g/dL (ref 13.0–17.7)
Hematocrit: 42.4 % (ref 37.5–51.0)
IMMATURE GRANS (ABS): 0 10*3/uL (ref 0.0–0.1)
Immature Granulocytes: 0 %
LYMPHS: 29 %
Lymphocytes Absolute: 2.3 10*3/uL (ref 0.7–3.1)
MCH: 31 pg (ref 26.6–33.0)
MCHC: 33 g/dL (ref 31.5–35.7)
MCV: 94 fL (ref 79–97)
MONOCYTES: 8 %
Monocytes Absolute: 0.6 10*3/uL (ref 0.1–0.9)
Neutrophils Absolute: 5 10*3/uL (ref 1.4–7.0)
Neutrophils: 61 %
Platelets: 335 10*3/uL (ref 150–379)
RBC: 4.52 x10E6/uL (ref 4.14–5.80)
RDW: 13.6 % (ref 12.3–15.4)
WBC: 8.1 10*3/uL (ref 3.4–10.8)

## 2018-02-23 LAB — COMPREHENSIVE METABOLIC PANEL
A/G RATIO: 1.9 (ref 1.2–2.2)
ALK PHOS: 58 IU/L (ref 39–117)
ALT: 31 IU/L (ref 0–44)
AST: 17 IU/L (ref 0–40)
Albumin: 4.8 g/dL (ref 3.5–5.5)
BUN/Creatinine Ratio: 12 (ref 9–20)
BUN: 13 mg/dL (ref 6–20)
CO2: 22 mmol/L (ref 20–29)
Calcium: 9.9 mg/dL (ref 8.7–10.2)
Chloride: 105 mmol/L (ref 96–106)
Creatinine, Ser: 1.12 mg/dL (ref 0.76–1.27)
GFR calc Af Amer: 105 mL/min/{1.73_m2} (ref >59)
GFR calc non Af Amer: 91 mL/min/{1.73_m2} (ref >59)
GLOBULIN, TOTAL: 2.5 g/dL (ref 1.5–4.5)
Glucose: 75 mg/dL (ref 65–99)
POTASSIUM: 4.6 mmol/L (ref 3.5–5.2)
SODIUM: 141 mmol/L (ref 134–144)
Total Protein: 7.3 g/dL (ref 6.0–8.5)

## 2018-02-23 LAB — TSH: TSH: 1.13 u[IU]/mL (ref 0.450–4.500)

## 2018-03-01 ENCOUNTER — Ambulatory Visit: Payer: Self-pay | Admitting: Family Medicine

## 2018-03-08 ENCOUNTER — Ambulatory Visit: Payer: Self-pay | Admitting: Family Medicine

## 2018-03-29 ENCOUNTER — Other Ambulatory Visit: Payer: Self-pay | Admitting: Family Medicine

## 2018-03-29 ENCOUNTER — Encounter: Payer: Self-pay | Admitting: Family Medicine

## 2018-04-09 ENCOUNTER — Other Ambulatory Visit: Payer: Self-pay | Admitting: Family Medicine

## 2018-06-04 ENCOUNTER — Other Ambulatory Visit: Payer: Self-pay | Admitting: Family Medicine

## 2018-07-09 ENCOUNTER — Other Ambulatory Visit: Payer: Self-pay | Admitting: Family Medicine

## 2018-07-10 ENCOUNTER — Other Ambulatory Visit: Payer: Self-pay | Admitting: Family Medicine

## 2018-07-10 NOTE — Telephone Encounter (Signed)
FYI-patient was supposed to be seen by psychiatry in April

## 2018-07-10 NOTE — Telephone Encounter (Signed)
FYI-patient was supposed to be seen by psychiatry in april

## 2018-07-16 ENCOUNTER — Other Ambulatory Visit: Payer: Self-pay | Admitting: Family Medicine

## 2018-07-17 NOTE — Telephone Encounter (Signed)
[facility] faxed refill request for the following medications:  lamoTRIgine (LAMICTAL) 150 MG tablet  Last Rx: 06/05/18 LOV: 02/12/18 Can another provider review request since Dr. Sullivan LoneGilbert is out of the this week? Please advise. Thanks TNP

## 2018-09-04 ENCOUNTER — Other Ambulatory Visit: Payer: Self-pay | Admitting: Family Medicine

## 2018-09-05 NOTE — Telephone Encounter (Signed)
Pharmacy requesting refills. Thanks!  

## 2018-09-13 NOTE — Telephone Encounter (Signed)
Will need to be seen to change meds--should have psychiatrist.

## 2018-09-14 NOTE — Telephone Encounter (Signed)
Patient is seeing the psychiatrist and they have already addressed this issue.  It was sen to [place] by mistake.

## 2018-09-28 ENCOUNTER — Encounter: Payer: Self-pay | Admitting: Family Medicine

## 2018-10-14 ENCOUNTER — Other Ambulatory Visit: Payer: Self-pay | Admitting: Family Medicine

## 2018-10-16 NOTE — Telephone Encounter (Signed)
Will forward to PCP  Gracelin Weisberg M, MD, MPH [facility] 10/16/2018 8:53 AM  

## 2018-11-11 ENCOUNTER — Other Ambulatory Visit: Payer: Self-pay | Admitting: Family Medicine

## 2018-11-17 ENCOUNTER — Telehealth: Payer: Self-pay | Admitting: Family Medicine
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
## 2018-11-17 NOTE — Telephone Encounter (Signed)
Please advise 

## 2018-11-29 ENCOUNTER — Other Ambulatory Visit: Payer: Self-pay | Admitting: Family Medicine

## 2018-11-30 ENCOUNTER — Other Ambulatory Visit: Payer: Self-pay | Admitting: Family Medicine

## 2019-01-11 NOTE — Telephone Encounter (Signed)
If she wants this addressed with Dr. Evelene Croon she simply has to call her office and leave the same message. That will be more helpful and meaningful.

## 2019-06-28 ENCOUNTER — Other Ambulatory Visit: Payer: Self-pay

## 2019-06-28 DIAGNOSIS — Z20822 Contact with and (suspected) exposure to covid-19: Secondary | ICD-10-CM

## 2019-07-03 LAB — NOVEL CORONAVIRUS, NAA: SARS-CoV-2, NAA: NOT DETECTED

## 2022-06-12 DEATH — deceased
